# Patient Record
Sex: Female | Born: 1955 | Race: White | Hispanic: No | Marital: Married | Smoking: Current every day smoker
Health system: Southern US, Community
[De-identification: ages and names within clinical notes are randomized; demographics above are authoritative.]

## PROBLEM LIST (undated history)

## (undated) DIAGNOSIS — S329XXA Fracture of unspecified parts of lumbosacral spine and pelvis, initial encounter for closed fracture: Secondary | ICD-10-CM

## (undated) DIAGNOSIS — Z8719 Personal history of other diseases of the digestive system: Secondary | ICD-10-CM

## (undated) DIAGNOSIS — J189 Pneumonia, unspecified organism: Secondary | ICD-10-CM

## (undated) DIAGNOSIS — D649 Anemia, unspecified: Secondary | ICD-10-CM

## (undated) DIAGNOSIS — S0291XA Unspecified fracture of skull, initial encounter for closed fracture: Secondary | ICD-10-CM

## (undated) DIAGNOSIS — A499 Bacterial infection, unspecified: Secondary | ICD-10-CM

## (undated) DIAGNOSIS — N390 Urinary tract infection, site not specified: Secondary | ICD-10-CM

## (undated) DIAGNOSIS — I639 Cerebral infarction, unspecified: Secondary | ICD-10-CM

## (undated) DIAGNOSIS — M797 Fibromyalgia: Secondary | ICD-10-CM

## (undated) DIAGNOSIS — K759 Inflammatory liver disease, unspecified: Secondary | ICD-10-CM

## (undated) DIAGNOSIS — I739 Peripheral vascular disease, unspecified: Secondary | ICD-10-CM

## (undated) HISTORY — PX: ABDOMINAL HYSTERECTOMY: SHX81

## (undated) HISTORY — PX: OTHER SURGICAL HISTORY: SHX169

## (undated) HISTORY — PX: BLADDER SURGERY: SHX569

## (undated) HISTORY — PX: DILATION AND CURETTAGE OF UTERUS: SHX78

## (undated) HISTORY — PX: TONSILLECTOMY: SUR1361

## (undated) HISTORY — PX: FOOT SURGERY: SHX648

---

## 1998-05-11 ENCOUNTER — Other Ambulatory Visit: Admission: RE | Admit: 1998-05-11 | Discharge: 1998-05-11 | Payer: Self-pay | Admitting: *Deleted

## 1999-08-09 ENCOUNTER — Encounter: Admission: RE | Admit: 1999-08-09 | Discharge: 1999-10-02 | Payer: Self-pay | Admitting: Family Medicine

## 2000-04-02 ENCOUNTER — Emergency Department (HOSPITAL_COMMUNITY): Admission: EM | Admit: 2000-04-02 | Discharge: 2000-04-02 | Payer: Self-pay

## 2000-06-23 ENCOUNTER — Encounter: Admission: RE | Admit: 2000-06-23 | Discharge: 2000-07-18 | Payer: Self-pay | Admitting: Family Medicine

## 2001-02-15 ENCOUNTER — Encounter: Payer: Self-pay | Admitting: Emergency Medicine

## 2001-02-15 ENCOUNTER — Emergency Department (HOSPITAL_COMMUNITY): Admission: EM | Admit: 2001-02-15 | Discharge: 2001-02-16 | Payer: Self-pay | Admitting: *Deleted

## 2002-01-01 ENCOUNTER — Emergency Department (HOSPITAL_COMMUNITY): Admission: EM | Admit: 2002-01-01 | Discharge: 2002-01-01 | Payer: Self-pay | Admitting: Emergency Medicine

## 2002-01-01 ENCOUNTER — Encounter: Payer: Self-pay | Admitting: Emergency Medicine

## 2002-01-06 ENCOUNTER — Emergency Department (HOSPITAL_COMMUNITY): Admission: EM | Admit: 2002-01-06 | Discharge: 2002-01-06 | Payer: Self-pay | Admitting: Emergency Medicine

## 2002-01-06 ENCOUNTER — Encounter: Payer: Self-pay | Admitting: Emergency Medicine

## 2002-01-08 ENCOUNTER — Emergency Department (HOSPITAL_COMMUNITY): Admission: EM | Admit: 2002-01-08 | Discharge: 2002-01-08 | Payer: Self-pay | Admitting: Emergency Medicine

## 2002-01-08 ENCOUNTER — Encounter: Payer: Self-pay | Admitting: Emergency Medicine

## 2002-01-09 ENCOUNTER — Emergency Department (HOSPITAL_COMMUNITY): Admission: EM | Admit: 2002-01-09 | Discharge: 2002-01-09 | Payer: Self-pay | Admitting: Emergency Medicine

## 2002-01-22 ENCOUNTER — Ambulatory Visit (HOSPITAL_COMMUNITY): Admission: RE | Admit: 2002-01-22 | Discharge: 2002-01-22 | Payer: Self-pay | Admitting: Family Medicine

## 2002-01-22 ENCOUNTER — Emergency Department (HOSPITAL_COMMUNITY): Admission: EM | Admit: 2002-01-22 | Discharge: 2002-01-22 | Payer: Self-pay | Admitting: Emergency Medicine

## 2002-01-22 ENCOUNTER — Encounter: Payer: Self-pay | Admitting: Family Medicine

## 2002-01-25 ENCOUNTER — Ambulatory Visit (HOSPITAL_COMMUNITY): Admission: RE | Admit: 2002-01-25 | Discharge: 2002-01-25 | Payer: Self-pay | Admitting: Family Medicine

## 2002-01-25 ENCOUNTER — Encounter: Payer: Self-pay | Admitting: Family Medicine

## 2003-05-17 ENCOUNTER — Encounter: Admission: RE | Admit: 2003-05-17 | Discharge: 2003-06-14 | Payer: Self-pay | Admitting: Endocrinology

## 2005-08-02 ENCOUNTER — Encounter: Admission: RE | Admit: 2005-08-02 | Discharge: 2005-08-02 | Payer: Self-pay | Admitting: Endocrinology

## 2010-10-08 ENCOUNTER — Ambulatory Visit: Payer: Self-pay | Admitting: Vascular Surgery

## 2010-12-15 ENCOUNTER — Encounter: Payer: Self-pay | Admitting: Endocrinology

## 2011-01-15 ENCOUNTER — Ambulatory Visit (INDEPENDENT_AMBULATORY_CARE_PROVIDER_SITE_OTHER): Payer: Medicare Other | Admitting: Vascular Surgery

## 2011-01-15 DIAGNOSIS — I83893 Varicose veins of bilateral lower extremities with other complications: Secondary | ICD-10-CM

## 2011-01-16 NOTE — Assessment & Plan Note (Signed)
OFFICE VISIT  KORTNEE, BAS DOB:  1956-07-29                                       01/15/2011 ZOXWR#:[id number]  The patient returns today for follow-up regarding her venous insufficiency of both lower extremities.  She has continued to have aching, throbbing and burning discomfort in both legs despite wearing long-leg elastic compression stockings, elevating her legs, and trying ibuprofen.  She has a history of acute thrombophlebitis in the right ankle where she has thrombus in a superficial varix in the past.  She continues to have worsening of her symptoms as the day progresses.  She has no history of stasis ulcers, bleeding or other complications.  She has documented gross reflux in both great saphenous veins up to and including the saphenofemoral junction with a widely patent deep system.  On exam today blood pressure is 138/78, heart rate 72, respirations 20. Her lower extremity exam reveals bulging varicosities in the distal thigh on the left and the medial calf on the left great saphenous system.  The right side has bulging varicosities below the knee over the great saphenous system with both legs have 1+ edema and early hyperpigmentation.  I would recommend proceeding with staged laser ablation with stab phlebectomies beginning with the right side and followed by the left frontal to be performed in the future.  We will proceed with precertification to perform this in the near future.    [name] [place], M.D. Electronically Signed  JDL/MEDQ  D:  01/15/2011  T:  01/16/2011  Job:  [id number]

## 2011-04-09 NOTE — Procedures (Signed)
LOWER EXTREMITY VENOUS REFLUX EXAM   INDICATION:  Varicose veins and pain, per standing order.   EXAM:  Using color-flow imaging and pulse Doppler spectral analysis, the  bilateral common femoral, superficial femoral, popliteal, posterior  tibial, greater and lesser saphenous veins were evaluated.  There is  evidence suggesting deep venous Reflux of  >500 milliseconds noted in  the bilateral common femoral and right popliteal veins.   The bilateral saphenofemoral junctions and nontortuous greater saphenous  veins are not competent with reflux of greater than 500 milliseconds.   The bilateral proximal short saphenous veins demonstrate competency.   GSV Diameter (used if found to be incompetent only)                                            Right         Left  Proximal Greater Saphenous Vein           0.93 cm       0.67 cm  Proximal-to-mid-thigh                     0.82 cm       0.5 cm  Mid thigh                                 0.74 cm       0.4 cm  Mid-distal thigh                          cm            cm  Distal thigh                              0.89 cm       0.59 cm  Knee                                      cm            cm   IMPRESSION:  1. Bilateral greater saphenous vein Reflux is noted, as described      above.  2. Deep venous system is noted, as described above.  3. The bilateral short saphenous veins are competent.   ___________________________________________  Quita Skye. Hart [place], M.D.   CH/MEDQ  D:  10/09/2010  T:  10/09/2010  Job:  [id number]

## 2011-04-09 NOTE — Consult Note (Signed)
NEW PATIENT CONSULTATION   Samantha Holmes, SCISM D  DOB:  [birth year]                                       10/08/2010  ZOXWR#:[id number]   The patient is a 55 year old female with a long history of venous  insufficiency of both lower extremities which has progressively caused  aching, throbbing and burning discomfort in both legs.  Several months  ago she experienced acute thrombophlebitis in the right ankle area where  she had thrombus in a superficial varix.  This was treated with heat and  analgesics and eventually resolved.  She has no history of deep venous  thrombosis but does have chronic swelling in both ankles as the day  progresses, right worse than left.  She describes throbbing and aching  discomfort.  She has no history of stasis ulcers, bleeding or other  complications.  She has worn short-leg elastic compression stockings  with some improvement but does not elevate her legs nor take pain  medication on a regular basis for this.   CHRONIC MEDICAL PROBLEMS:  1. Fibromyalgia.  2. Hyperlipidemia.  3. Remote history of right foot surgery by Dr. Jovita Gamma with chronic      pain.  4. History of fractured pelvis and skull fracture.  5. Negative for diabetes, hypertension, coronary artery disease, COPD      or stroke.   FAMILY HISTORY:  Positive for varicose veins in her sister who had vein  stripping and in her father.  Negative for coronary artery disease,  diabetes and stroke.   SOCIAL HISTORY:  She is married, does not have children.  She is  retired.  She smokes less than a pack of cigarettes per day.  Does not  use alcohol.   REVIEW OF SYSTEMS:  Positive for weight loss, hiatal hernia which is  asymptomatic, lower extremity discomfort as noted, headaches, mini  stroke in the past, fibromyalgia and rashes.  All other systems are  negative.   PHYSICAL EXAMINATION:  Vital signs:  Blood pressure 125/80, heart rate  79, respirations 20.  General:  She  is a well-developed, well-nourished  female in no apparent distress, alert and oriented x3.  HEENT:  Normal  for age.  EOMs intact.  Lungs:  Clear to auscultation.  No rhonchi or  wheezing.  Cardiovascular:  Regular rhythm.  No murmurs.  Abdomen:  Soft, nontender with no masses.  Musculoskeletal:  Free of major  deformities.  Neurological:  Normal.  Lower extremity exam reveals 3+  femoral, popliteal and dorsalis pedis pulses bilaterally.  Skin:  Reveals bulging varicosities bilaterally in the great saphenous system  in the medial thigh, left greater than right; in the medial calf right  equal to left with an area where the thrombophlebitis occurred just  proximal to the right medial malleolus.  There is an extensive network  of reticular and spider veins extending into the feet bilaterally with  1+ edema bilaterally and some pretibial bulging varicosities as well.  No active ulcers noted.   Today I ordered a venous duplex exam which I reviewed and interpreted.  She has some mild reflux in the deep systems bilaterally with no DVT.  She has bilateral severe reflux in the great saphenous systems extending  up to and including the saphenofemoral junction.   This patient has had significant symptoms involving her valvular  incompetence and venous  insufficiency of both great saphenous systems  which have been affecting her daily living and have also resulted in the  complication of thrombophlebitis in the right leg.  We will treat her  with long-leg elastic compression stockings (20 mm - 30 mm gradient) as  well as elevation and ibuprofen.  She will return in 3 months and unless  there has been significant improvement I think we should proceed with:  1) laser ablation of the right great saphenous vein with multiple stab  phlebectomies to be followed by 2) laser ablation of the left great  saphenous vein with multiple stab phlebectomies.     Samantha Holmes, M.D.  Electronically  Signed   JDL/MEDQ  D:  10/08/2010  T:  10/09/2010  Job:  [id number]   cc:   Brooke Bonito, M.D.

## 2012-10-28 ENCOUNTER — Other Ambulatory Visit: Payer: Self-pay | Admitting: Pain Medicine

## 2012-10-30 ENCOUNTER — Encounter (HOSPITAL_COMMUNITY): Payer: Self-pay | Admitting: *Deleted

## 2012-10-30 NOTE — Progress Notes (Signed)
Patient has temporary cap on upper right tooth.

## 2012-11-02 ENCOUNTER — Encounter (HOSPITAL_COMMUNITY): Payer: Self-pay | Admitting: Certified Registered Nurse Anesthetist

## 2012-11-02 ENCOUNTER — Observation Stay (HOSPITAL_COMMUNITY)
Admission: RE | Admit: 2012-11-02 | Discharge: 2012-11-03 | Disposition: A | Discharge status: Home / Self Care | Payer: Medicare Other | Source: Ambulatory Visit | Attending: Specialist | Admitting: Specialist

## 2012-11-02 ENCOUNTER — Encounter (HOSPITAL_COMMUNITY): Payer: Self-pay | Admitting: *Deleted

## 2012-11-02 ENCOUNTER — Ambulatory Visit (HOSPITAL_COMMUNITY): Payer: Medicare Other

## 2012-11-02 ENCOUNTER — Ambulatory Visit (HOSPITAL_COMMUNITY): Payer: Medicare Other | Admitting: Certified Registered Nurse Anesthetist

## 2012-11-02 ENCOUNTER — Encounter (HOSPITAL_COMMUNITY)
Admission: RE | Disposition: A | Discharge status: Home / Self Care | Payer: Self-pay | Source: Ambulatory Visit | Attending: Specialist

## 2012-11-02 DIAGNOSIS — Z9889 Other specified postprocedural states: Secondary | ICD-10-CM

## 2012-11-02 DIAGNOSIS — Z8673 Personal history of transient ischemic attack (TIA), and cerebral infarction without residual deficits: Secondary | ICD-10-CM | POA: Insufficient documentation

## 2012-11-02 DIAGNOSIS — IMO0001 Reserved for inherently not codable concepts without codable children: Secondary | ICD-10-CM | POA: Insufficient documentation

## 2012-11-02 DIAGNOSIS — Z79899 Other long term (current) drug therapy: Secondary | ICD-10-CM | POA: Insufficient documentation

## 2012-11-02 DIAGNOSIS — K449 Diaphragmatic hernia without obstruction or gangrene: Secondary | ICD-10-CM | POA: Insufficient documentation

## 2012-11-02 DIAGNOSIS — S82009A Unspecified fracture of unspecified patella, initial encounter for closed fracture: Principal | ICD-10-CM | POA: Insufficient documentation

## 2012-11-02 DIAGNOSIS — R296 Repeated falls: Secondary | ICD-10-CM | POA: Insufficient documentation

## 2012-11-02 DIAGNOSIS — I739 Peripheral vascular disease, unspecified: Secondary | ICD-10-CM | POA: Insufficient documentation

## 2012-11-02 DIAGNOSIS — Z01811 Encounter for preprocedural respiratory examination: Secondary | ICD-10-CM | POA: Insufficient documentation

## 2012-11-02 DIAGNOSIS — Z7982 Long term (current) use of aspirin: Secondary | ICD-10-CM | POA: Insufficient documentation

## 2012-11-02 DIAGNOSIS — F17200 Nicotine dependence, unspecified, uncomplicated: Secondary | ICD-10-CM | POA: Insufficient documentation

## 2012-11-02 HISTORY — DX: Personal history of other diseases of the digestive system: Z87.19

## 2012-11-02 HISTORY — DX: Inflammatory liver disease, unspecified: K75.9

## 2012-11-02 HISTORY — DX: Cerebral infarction, unspecified: I63.9

## 2012-11-02 HISTORY — DX: Anemia, unspecified: D64.9

## 2012-11-02 HISTORY — DX: Fibromyalgia: M79.7

## 2012-11-02 HISTORY — PX: ORIF PATELLA: SHX5033

## 2012-11-02 HISTORY — DX: Peripheral vascular disease, unspecified: I73.9

## 2012-11-02 HISTORY — DX: Fracture of unspecified parts of lumbosacral spine and pelvis, initial encounter for closed fracture: S32.9XXA

## 2012-11-02 HISTORY — DX: Bacterial infection, unspecified: A49.9

## 2012-11-02 HISTORY — DX: Unspecified fracture of skull, initial encounter for closed fracture: S02.91XA

## 2012-11-02 HISTORY — DX: Pneumonia, unspecified organism: J18.9

## 2012-11-02 HISTORY — DX: Urinary tract infection, site not specified: N39.0

## 2012-11-02 LAB — BASIC METABOLIC PANEL
BUN: 12 mg/dL (ref 6–23)
Calcium: 9.7 mg/dL (ref 8.4–10.5)
Creatinine, Ser: 0.74 mg/dL (ref 0.50–1.10)
GFR calc non Af Amer: >90 mL/min (ref >90)
Glucose, Bld: 114 mg/dL — ABNORMAL HIGH (ref 70–99)
Potassium: 4.3 mEq/L (ref 3.5–5.1)

## 2012-11-02 LAB — CBC
MCH: 29.6 pg (ref 26.0–34.0)
MCHC: 32.6 g/dL (ref 30.0–36.0)
MCV: 90.9 fL (ref 78.0–100.0)
Platelets: 317 10*3/uL (ref 150–400)
RDW: 13.3 % (ref 11.5–15.5)

## 2012-11-02 LAB — SURGICAL PCR SCREEN: MRSA, PCR: NEGATIVE

## 2012-11-02 SURGERY — OPEN REDUCTION INTERNAL FIXATION (ORIF) PATELLA
Anesthesia: General | Site: Knee | Laterality: Right | Wound class: Clean

## 2012-11-02 MED ORDER — HYDROMORPHONE HCL PF 1 MG/ML IJ SOLN: 0.5000 mg | INTRAMUSCULAR | Status: DC | PRN

## 2012-11-02 MED ORDER — MUPIROCIN 2 % EX OINT
TOPICAL_OINTMENT | Freq: Two times a day (BID) | CUTANEOUS | Status: DC
  Administered 2012-11-02: 1 via NASAL
  Filled 2012-11-02 (×2): qty 22

## 2012-11-02 MED ORDER — ONDANSETRON HCL 4 MG/2ML IJ SOLN: 4.0000 mg | Freq: Four times a day (QID) | INTRAMUSCULAR | Status: DC | PRN

## 2012-11-02 MED ORDER — FENTANYL CITRATE 0.05 MG/ML IJ SOLN
25.0000 ug | INTRAMUSCULAR | Status: DC | PRN
  Administered 2012-11-02 (×2): 25 ug via INTRAVENOUS

## 2012-11-02 MED ORDER — CEFAZOLIN SODIUM-DEXTROSE 2-3 GM-% IV SOLR
2.0000 g | INTRAVENOUS | Status: AC
  Administered 2012-11-02: 2 g via INTRAVENOUS

## 2012-11-02 MED ORDER — LACTATED RINGERS IV SOLN
INTRAVENOUS | Status: DC | PRN
  Administered 2012-11-02: 16:00:00 via INTRAVENOUS

## 2012-11-02 MED ORDER — DOCUSATE SODIUM 100 MG PO CAPS
100.0000 mg | ORAL_CAPSULE | Freq: Two times a day (BID) | ORAL | Status: DC
  Administered 2012-11-02 – 2012-11-03 (×2): 100 mg via ORAL

## 2012-11-02 MED ORDER — DIPHENHYDRAMINE HCL 12.5 MG/5ML PO ELIX: 12.5000 mg | ORAL_SOLUTION | ORAL | Status: DC | PRN

## 2012-11-02 MED ORDER — BISACODYL 10 MG RE SUPP: 10.0000 mg | Freq: Every day | RECTAL | Status: DC | PRN

## 2012-11-02 MED ORDER — ESMOLOL HCL 10 MG/ML IV SOLN
INTRAVENOUS | Status: DC | PRN
  Administered 2012-11-02: 20 mg via INTRAVENOUS

## 2012-11-02 MED ORDER — ZOLPIDEM TARTRATE 5 MG PO TABS: 5.0000 mg | ORAL_TABLET | Freq: Every evening | ORAL | Status: DC | PRN

## 2012-11-02 MED ORDER — HYDROMORPHONE HCL PF 1 MG/ML IJ SOLN
INTRAMUSCULAR | Status: DC | PRN
  Administered 2012-11-02 (×2): 0.5 mg via INTRAVENOUS
  Administered 2012-11-02: 1 mg via INTRAVENOUS

## 2012-11-02 MED ORDER — CEFAZOLIN SODIUM-DEXTROSE 2-3 GM-% IV SOLR
2.0000 g | Freq: Four times a day (QID) | INTRAVENOUS | Status: AC
  Administered 2012-11-02 – 2012-11-03 (×3): 2 g via INTRAVENOUS
  Filled 2012-11-02 (×3): qty 50

## 2012-11-02 MED ORDER — ONDANSETRON HCL 4 MG PO TABS: 4.0000 mg | ORAL_TABLET | Freq: Four times a day (QID) | ORAL | Status: DC | PRN

## 2012-11-02 MED ORDER — NEOSTIGMINE METHYLSULFATE 1 MG/ML IJ SOLN
INTRAMUSCULAR | Status: DC | PRN
  Administered 2012-11-02: 5 mg via INTRAVENOUS

## 2012-11-02 MED ORDER — PROMETHAZINE HCL 25 MG/ML IJ SOLN: 6.2500 mg | INTRAMUSCULAR | Status: DC | PRN

## 2012-11-02 MED ORDER — SUCCINYLCHOLINE CHLORIDE 20 MG/ML IJ SOLN
INTRAMUSCULAR | Status: DC | PRN
  Administered 2012-11-02: 100 mg via INTRAVENOUS

## 2012-11-02 MED ORDER — FLEET ENEMA 7-19 GM/118ML RE ENEM: 1.0000 | ENEMA | Freq: Once | RECTAL | Status: AC | PRN

## 2012-11-02 MED ORDER — POLYETHYLENE GLYCOL 3350 17 G PO PACK: 17.0000 g | PACK | Freq: Every day | ORAL | Status: DC | PRN

## 2012-11-02 MED ORDER — SODIUM CHLORIDE 0.9 % IV SOLN
INTRAVENOUS | Status: DC
  Administered 2012-11-02: 22:00:00 via INTRAVENOUS

## 2012-11-02 MED ORDER — LIDOCAINE HCL (CARDIAC) 20 MG/ML IV SOLN
INTRAVENOUS | Status: DC | PRN
  Administered 2012-11-02: 100 mg via INTRAVENOUS

## 2012-11-02 MED ORDER — ACETAMINOPHEN 10 MG/ML IV SOLN
1000.0000 mg | Freq: Four times a day (QID) | INTRAVENOUS | Status: DC
  Administered 2012-11-03 (×3): 1000 mg via INTRAVENOUS
  Filled 2012-11-02 (×5): qty 100

## 2012-11-02 MED ORDER — ACETAMINOPHEN 10 MG/ML IV SOLN
INTRAVENOUS | Status: DC | PRN
  Administered 2012-11-02: 1000 mg via INTRAVENOUS

## 2012-11-02 MED ORDER — HYDROMORPHONE HCL PF 1 MG/ML IJ SOLN
INTRAMUSCULAR | Status: AC
  Administered 2012-11-02: 0.5 mg
  Filled 2012-11-02: qty 1

## 2012-11-02 MED ORDER — METOCLOPRAMIDE HCL 10 MG PO TABS: 5.0000 mg | ORAL_TABLET | Freq: Three times a day (TID) | ORAL | Status: DC | PRN

## 2012-11-02 MED ORDER — ASPIRIN 325 MG PO TABS
325.0000 mg | ORAL_TABLET | Freq: Two times a day (BID) | ORAL | Status: DC
  Administered 2012-11-02 – 2012-11-03 (×2): 325 mg via ORAL
  Filled 2012-11-02 (×3): qty 1

## 2012-11-02 MED ORDER — METHOCARBAMOL 500 MG PO TABS
500.0000 mg | ORAL_TABLET | Freq: Four times a day (QID) | ORAL | Status: DC | PRN
  Administered 2012-11-03 (×2): 500 mg via ORAL
  Filled 2012-11-02 (×3): qty 1

## 2012-11-02 MED ORDER — ONDANSETRON HCL 4 MG/2ML IJ SOLN
INTRAMUSCULAR | Status: DC | PRN
  Administered 2012-11-02: 4 mg via INTRAVENOUS

## 2012-11-02 MED ORDER — METOCLOPRAMIDE HCL 5 MG/ML IJ SOLN: 5.0000 mg | Freq: Three times a day (TID) | INTRAMUSCULAR | Status: DC | PRN

## 2012-11-02 MED ORDER — HYDROMORPHONE HCL PF 1 MG/ML IJ SOLN
0.2500 mg | INTRAMUSCULAR | Status: DC | PRN
Start: 2012-11-02 — End: 2012-11-02
  Administered 2012-11-02: 0.5 mg via INTRAVENOUS
  Administered 2012-11-02: 0.25 mg via INTRAVENOUS
  Administered 2012-11-02: 0.5 mg via INTRAVENOUS
  Administered 2012-11-02: 0.25 mg via INTRAVENOUS
  Administered 2012-11-02: 0.5 mg via INTRAVENOUS

## 2012-11-02 MED ORDER — ENOXAPARIN SODIUM 30 MG/0.3ML ~~LOC~~ SOLN
30.0000 mg | Freq: Two times a day (BID) | SUBCUTANEOUS | Status: DC
  Administered 2012-11-03: 30 mg via SUBCUTANEOUS
  Filled 2012-11-02 (×3): qty 0.3

## 2012-11-02 MED ORDER — METHOCARBAMOL 100 MG/ML IJ SOLN
500.0000 mg | Freq: Four times a day (QID) | INTRAVENOUS | Status: DC | PRN
  Administered 2012-11-02: 500 mg via INTRAVENOUS
  Filled 2012-11-02: qty 5

## 2012-11-02 MED ORDER — GLYCOPYRROLATE 0.2 MG/ML IJ SOLN
INTRAMUSCULAR | Status: DC | PRN
  Administered 2012-11-02: 0.6 mg via INTRAVENOUS

## 2012-11-02 MED ORDER — 0.9 % SODIUM CHLORIDE (POUR BTL) OPTIME
TOPICAL | Status: DC | PRN
  Administered 2012-11-02: 1000 mL

## 2012-11-02 MED ORDER — FENTANYL CITRATE 0.05 MG/ML IJ SOLN
INTRAMUSCULAR | Status: DC | PRN
  Administered 2012-11-02 (×5): 50 ug via INTRAVENOUS

## 2012-11-02 MED ORDER — ROCURONIUM BROMIDE 100 MG/10ML IV SOLN
INTRAVENOUS | Status: DC | PRN
  Administered 2012-11-02: 30 mg via INTRAVENOUS

## 2012-11-02 MED ORDER — PROPOFOL 10 MG/ML IV BOLUS
INTRAVENOUS | Status: DC | PRN
  Administered 2012-11-02: 150 mg via INTRAVENOUS

## 2012-11-02 MED ORDER — OXYCODONE HCL 5 MG PO TABS
5.0000 mg | ORAL_TABLET | ORAL | Status: DC | PRN
  Administered 2012-11-02: 5 mg via ORAL
  Administered 2012-11-03 (×4): 10 mg via ORAL
  Filled 2012-11-02: qty 1
  Filled 2012-11-02 (×4): qty 2

## 2012-11-02 MED ORDER — SODIUM CHLORIDE 0.9 % IV SOLN: INTRAVENOUS | Status: DC

## 2012-11-02 MED ORDER — NAPHAZOLINE-PHENIRAMINE 0.025-0.3 % OP SOLN: 2.0000 [drp] | Freq: Three times a day (TID) | OPHTHALMIC | Status: DC | PRN

## 2012-11-02 MED ORDER — POVIDONE-IODINE 7.5 % EX SOLN: Freq: Once | CUTANEOUS | Status: DC

## 2012-11-02 MED ORDER — SODIUM CHLORIDE 0.9 % IV SOLN
INTRAVENOUS | Status: DC
  Administered 2012-11-02: 1000 mL via INTRAVENOUS

## 2012-11-02 SURGICAL SUPPLY — 51 items
BAG ZIPLOCK 12X15 (MISCELLANEOUS) ×2 IMPLANT
BANDAGE ELASTIC 6 VELCRO ST LF (GAUZE/BANDAGES/DRESSINGS) ×2 IMPLANT
BANDAGE GAUZE ELAST BULKY 4 IN (GAUZE/BANDAGES/DRESSINGS) ×2 IMPLANT
BIT DRILL 2.4X128 (BIT) ×2 IMPLANT
BNDG COHESIVE 4X5 TAN STRL (GAUZE/BANDAGES/DRESSINGS) IMPLANT
BNDG COHESIVE 6X5 TAN STRL LF (GAUZE/BANDAGES/DRESSINGS) ×2 IMPLANT
CLOTH BEACON ORANGE TIMEOUT ST (SAFETY) ×2 IMPLANT
CUFF TOURN SGL QUICK 34 (TOURNIQUET CUFF) ×1
CUFF TRNQT CYL 34X4X40X1 (TOURNIQUET CUFF) ×1 IMPLANT
DRAPE INCISE IOBAN 66X45 STRL (DRAPES) IMPLANT
DRAPE LG THREE QUARTER DISP (DRAPES) ×2 IMPLANT
DRAPE OEC MINIVIEW 54X84 (DRAPES) ×2 IMPLANT
DRAPE U-SHAPE 47X51 STRL (DRAPES) ×2 IMPLANT
DRSG EMULSION OIL 3X16 NADH (GAUZE/BANDAGES/DRESSINGS) IMPLANT
DRSG PAD ABDOMINAL 8X10 ST (GAUZE/BANDAGES/DRESSINGS) ×2 IMPLANT
DURAPREP 26ML APPLICATOR (WOUND CARE) ×2 IMPLANT
ELECT REM PT RETURN 9FT ADLT (ELECTROSURGICAL) ×2
ELECTRODE REM PT RTRN 9FT ADLT (ELECTROSURGICAL) ×1 IMPLANT
FACESHIELD LNG OPTICON STERILE (SAFETY) ×4 IMPLANT
GLOVE BIOGEL PI IND STRL 8 (GLOVE) ×2 IMPLANT
GLOVE BIOGEL PI INDICATOR 8 (GLOVE) ×2
GLOVE SURG ORTHO 8.0 STRL STRW (GLOVE) ×2 IMPLANT
GOWN STRL REIN XL XLG (GOWN DISPOSABLE) ×2 IMPLANT
IMMOBILIZER KNEE 20 (SOFTGOODS)
IMMOBILIZER KNEE 20 THIGH 36 (SOFTGOODS) IMPLANT
KIT BASIN OR (CUSTOM PROCEDURE TRAY) ×2 IMPLANT
MANIFOLD NEPTUNE II (INSTRUMENTS) IMPLANT
NEEDLE MA TROC 1/2 (NEEDLE) ×2 IMPLANT
NS IRRIG 1000ML POUR BTL (IV SOLUTION) IMPLANT
PACK LOWER EXTREMITY WL (CUSTOM PROCEDURE TRAY) ×2 IMPLANT
PAD CAST 4YDX4 CTTN HI CHSV (CAST SUPPLIES) IMPLANT
PADDING CAST COTTON 4X4 STRL (CAST SUPPLIES)
PASSER SUT SWANSON 36MM LOOP (INSTRUMENTS) ×2 IMPLANT
POSITIONER SURGICAL ARM (MISCELLANEOUS) IMPLANT
SPONGE GAUZE 4X4 12PLY (GAUZE/BANDAGES/DRESSINGS) ×2 IMPLANT
SPONGE LAP 18X18 X RAY DECT (DISPOSABLE) ×2 IMPLANT
STOCKINETTE 8 INCH (MISCELLANEOUS) ×2 IMPLANT
STRIP CLOSURE SKIN 1/2X4 (GAUZE/BANDAGES/DRESSINGS) IMPLANT
SUCTION FRAZIER TIP 10 FR DISP (SUCTIONS) ×2 IMPLANT
SUT ETHILON 4 0 PS 2 18 (SUTURE) IMPLANT
SUT FIBERWIRE #2 38 T-5 BLUE (SUTURE) ×8
SUT MNCRL AB 3-0 PS2 18 (SUTURE) ×2 IMPLANT
SUT MNCRL AB 4-0 PS2 18 (SUTURE) IMPLANT
SUT VIC AB 1 CT1 27 (SUTURE) ×3
SUT VIC AB 1 CT1 27XBRD ANTBC (SUTURE) ×3 IMPLANT
SUT VIC AB 2-0 CT1 27 (SUTURE) ×2
SUT VIC AB 2-0 CT1 TAPERPNT 27 (SUTURE) ×2 IMPLANT
SUT VIC AB 2-0 CT2 27 (SUTURE) IMPLANT
SUTURE FIBERWR #2 38 T-5 BLUE (SUTURE) ×4 IMPLANT
TOWEL OR 17X26 10 PK STRL BLUE (TOWEL DISPOSABLE) ×4 IMPLANT
WATER STERILE IRR 1500ML POUR (IV SOLUTION) IMPLANT

## 2012-11-02 NOTE — Transfer of Care (Signed)
Immediate Anesthesia Transfer of Care Note  Patient: Samantha Holmes  Procedure(s) Performed: Procedure(s) (LRB): OPEN REDUCTION INTERNAL (ORIF) FIXATION PATELLA (Right)  Patient Location: PACU  Anesthesia Type: General  Level of Consciousness: sedated, patient cooperative and responds to stimulaton  Airway & Oxygen Therapy: Patient Spontanous Breathing and Patient connected to face mask oxgen  Post-op Assessment: Report given to PACU RN and Post -op Vital signs reviewed and stable  Post vital signs: Reviewed and stable  Complications: No apparent anesthesia complications

## 2012-11-02 NOTE — Anesthesia Preprocedure Evaluation (Addendum)
Anesthesia Evaluation  Patient identified by MRN, date of birth, ID band Patient awake    Reviewed: Allergy & Precautions, H&P , NPO status , Patient's Chart, lab work & pertinent test results, reviewed documented beta blocker date and time   Airway Mallampati: II TM Distance: >3 FB Neck ROM: full    Dental No notable dental hx.    Pulmonary pneumonia -, resolved, Current Smoker,  Pneumonia 2003 breath sounds clear to auscultation  Pulmonary exam normal       Cardiovascular Exercise Tolerance: Good + Peripheral Vascular Disease negative cardio ROS  Rhythm:regular Rate:Normal  Varicose veins.   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 No deficits.Had accident in 2007 involving skull.  Neuromuscular disease CVA, No Residual Symptoms negative psych ROS   GI/Hepatic negative GI ROS, Neg liver ROS, hiatal hernia, (+) Hepatitis -  Endo/Other  negative endocrine ROS  Renal/GU negative Renal ROS  negative genitourinary   Musculoskeletal   Abdominal   Peds  Hematology negative hematology ROS (+) anemia ,   Anesthesia Other Findings   Reproductive/Obstetrics negative OB ROS                         Anesthesia Physical Anesthesia Plan  ASA: III  Anesthesia Plan: General   Post-op Pain Management:    Induction:   Airway Management Planned: LMA  Additional Equipment:   Intra-op Plan:   Post-operative Plan:   Informed Consent: I have reviewed the patients History and Physical, chart, labs and discussed the procedure including the risks, benefits and alternatives for the proposed anesthesia with the patient or authorized representative who has indicated his/her understanding and acceptance.   Dental Advisory Given  Plan Discussed with: CRNA  Anesthesia Plan Comments: (Discussed risks of femoral nerve block including failure, bleeding, infection, nerve damage.  Femoral nerve  block does not usually prevent all pain. After long discussion of benefits and risks and the procedure of the block (including the offered sedation to perform the block), the patient declines the block and understands she may have increased pain after surgery.  She also declined a spinal after discussion of r/b/a..)      Anesthesia Quick Evaluation

## 2012-11-02 NOTE — H&P (Signed)
Samantha Holmes is an 56 y.o. female.   Chief Complaint: Right knee pain HPI: 80 yoa female fell on sidewalk landing on right knee causing pain swelling and brusing, in ER found to have right patella fx. Seen by Dr Thomasena Edis and determined to need ORIF vs patellectomy of right patella fx and pt agreed to proceed.  Past Medical History  Diagnosis Date  . Fibromyalgia   . Pneumonia     hx of in 2003   . Stroke     hx of ministrokes per pt on CT of head done 2007   . Peripheral vascular disease     varicose veins   . H/O hiatal hernia   . Urinary tract bacterial infections     hx of   . Anemia     hx of as a small child   . Hepatitis     hx of yellow jaundice in 3rd grade   . Pelvic fracture   . 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 Tonsillectomy   . Bladder surgery     as a child " dilate bladder"   . Foot surgery     right   . Cyst on ovary    . Dilation and curettage of uterus   . Abdominal hysterectomy     partial     History reviewed. No pertinent family history. Social History:  reports that she has been smoking Cigarettes.  She has a 15 pack-year smoking history. She has never used smokeless tobacco. She reports that she drinks alcohol. She reports that she does not use illicit drugs.  Allergies:  Allergies  Allergen Reactions  . Codeine Nausea And Vomiting  . Cymbalta (Duloxetine Hcl) Other (See Comments)    Due to side effects   . Lyrica (Pregabalin) Other (See Comments)    Vision problems and other side effects   . Macrodantin (Nitrofurantoin) Swelling    Reddened in face   . Sulfa Antibiotics     Unsure of reaction     Medications Prior to Admission  Medication Sig Dispense Refill  . aspirin 325 MG tablet Take 325 mg by mouth 2 (two) times daily.      . cephALEXin (KEFLEX) 500 MG capsule Take 500 mg by mouth daily. Used for skin problems  Related to history of  shingles      . CRANBERRY SOFT PO Take by mouth daily. Pt takes 84 mg of soft gel cranberry daily      . tetrahydrozoline-zinc (VISINE-AC) 0.05-0.25 % ophthalmic solution 2 drops 3 (three) times daily as needed. Patient uses several times per week      . traMADol (ULTRAM) 50 MG tablet Take 50 mg by mouth every 6 (six) hours as needed. 1-2 tables by mouth 4 times daily        No results found for this or any previous visit (from the past 48 hour(s)). Dg Chest 2 View  11/02/2012  *RADIOLOGY REPORT*  Clinical Data: Patellar fracture.  Preop respiratory exam.  CHEST - 2 VIEW  Comparison: 09/26/2010  Findings: Heart size remains within normal limits.  Both lungs are clear.  No evidence of pleural effusion.  No mass or lymphadenopathy identified.  The old right rib fracture deformities again noted.  IMPRESSION: Stable exam.  No active disease.   Original Report Authenticated By: Myles Rosenthal, M.D.  Review of Systems  All other systems reviewed and are negative.    Blood pressure 129/70, pulse 77, temperature 97.7 F (36.5 C), temperature source Oral, resp. rate 18, height 5\' 8"  (1.727 m), weight 98.431 kg (217 lb), SpO2 95.00%. Physical Exam  Patient is a conscious alert and well-developed female appears to be in no extreme distress in the OR preop area. Head is normocephalic neck is supple no palpable lymphadenopathy lungs were clear and equal throughout heart was regular rate and rhythm abdomen was soft bowel sounds present upper extremities were neuromotor vascularly intact with good range of motion lower extremity on the left head normal exam without any deficiencies right leg had a significantly ecchymotic knee with swelling very tender around the patella area the And thigh were soft nontender the ankle had good motion good pulses in the lower extremity rest rectal into exam is referred  Assessment/Plan Right knee comminuted patella fracture History of fibromyalgia  Plan patient be taken to  the OR for evaluation for consideration of ORIF versus patella ectomy of her right knee comminuted fracture patient will be kept overnight and discharged home tomorrow with routine followup in the office  [name] W 11/02/2012, 4:29 PM

## 2012-11-02 NOTE — Op Note (Signed)
Preop diagnosis right knee comminuted displaced unstable patella fracture Postoperative diagnosis same Procedure #1 right partial patellectomy. #2 repair of extensor mechanism.  Surgeon Valma Cava M.D. assistant Nadine Counts [facility] Anesthesia Gen. Estimated blood loss less than 50 cc Drains none Tourniquet time was 50 minutes at 2 300 mm mercury Complications none Disposition to PACU stable  Operative details patient was counseled in the holding area cracks I. was identified and marked appropriately.  Patient states operating room placed in the supine position under general anesthesia. Vital shunt was elevated prepped DuraPrep and draped into a sterile fashion. Knee was Elijah Birk out was done and confirmed. There was elevated exsanguinated with an Esmarch and tourniquet inflated to 300 mm mercury. Straight midline incision made through the skin subcutaneous tissues. There, was identified and opened longitudinally. Retinacular layer was dissected. The fracture was opened hematoma was removed knee was copious irrigated. SNF a comminuted inferior loss less than 20 septic the surface there was marked comminution the distal fragment. Subcutaneous this was a repairable it was reamed the scalpel lives drill holes were made and the patella #2 FiberWire sutures placed in a tendon grasping fashion with the girls in the patella anatomically repaired at the appropriate tension. This is an oversew with more on #2 the FiberWire suture. Lateral capsular openings with a repairable FiberWire suture. The retinaculum Vicryl suture. Subcutaneous Vicryl. Skin with a subcuticular Monocryl suture. Steri-Strips applied stertorous applied compressive wrap tourniquet deflated normal circulation in the foot at in the case. She also received 2 g of Ancef intravenously preoperatively prior to tourniquet inflation. Normal circulation in the foot and ankle. Placed into a brace in full extension. Awakened taken from operating room to PACU in  stable condition. To be kept overnight.  Help with patient positioning prepping draping technical and surgical assistance throughout the entire case wound closure application dressing and splint Mr. Oneida Alar PA-C assistance was needed.

## 2012-11-02 NOTE — Anesthesia Postprocedure Evaluation (Signed)
  Anesthesia Post-op Note  Patient: Samantha Holmes  Procedure(s) Performed: Procedure(s) (LRB): OPEN REDUCTION INTERNAL (ORIF) FIXATION PATELLA (Right)  Patient Location: PACU  Anesthesia Type: General  Level of Consciousness: awake and alert   Airway and Oxygen Therapy: Patient Spontanous Breathing  Post-op Pain: mild  Post-op Assessment: Post-op Vital signs reviewed, Patient's Cardiovascular Status Stable, Respiratory Function Stable, Patent Airway and No signs of Nausea or vomiting  Last Vitals:  Filed Vitals:   11/02/12 1915  BP: 144/63  Pulse: 70  Temp:   Resp: 16    Post-op Vital Signs: stable   Complications: No apparent anesthesia complications

## 2012-11-02 NOTE — Preoperative (Signed)
Beta Blockers   Reason not to administer Beta Blockers:Not Applicable 

## 2012-11-02 NOTE — H&P (Signed)
Samantha Holmes is an 56 y.o. female.   Chief Complaint: Right knee pain HPI: 80 yoa female fell on sidewalk landing on right knee causing pain swelling and brusing, in ER found to have right patella fx. Seen by Dr Thomasena Edis and determined to need ORIF vs patellectomy of right patella fx and pt agreed to proceed.  Past Medical History  Diagnosis Date  . Fibromyalgia   . Pneumonia     hx of in 2003   . Stroke     hx of ministrokes per pt on CT of head done 2007   . Peripheral vascular disease     varicose veins   . H/O hiatal hernia   . Urinary tract bacterial infections     hx of   . Anemia     hx of as a small child   . Hepatitis     hx of yellow jaundice in 3rd grade   . Pelvic fracture   . 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 Tonsillectomy   . Bladder surgery     as a child " dilate bladder"   . Foot surgery     right   . Cyst on ovary    . Dilation and curettage of uterus   . Abdominal hysterectomy     partial     History reviewed. No pertinent family history. Social History:  reports that she has been smoking Cigarettes.  She has a 15 pack-year smoking history. She has never used smokeless tobacco. She reports that she drinks alcohol. She reports that she does not use illicit drugs.  Allergies:  Allergies  Allergen Reactions  . Codeine Nausea And Vomiting  . Cymbalta (Duloxetine Hcl) Other (See Comments)    Due to side effects   . Lyrica (Pregabalin) Other (See Comments)    Vision problems and other side effects   . Macrodantin (Nitrofurantoin) Swelling    Reddened in face   . Sulfa Antibiotics     Unsure of reaction     Medications Prior to Admission  Medication Sig Dispense Refill  . aspirin 325 MG tablet Take 325 mg by mouth 2 (two) times daily.      . cephALEXin (KEFLEX) 500 MG capsule Take 500 mg by mouth daily. Used for skin problems  Related to history of  shingles      . CRANBERRY SOFT PO Take by mouth daily. Pt takes 84 mg of soft gel cranberry daily      . tetrahydrozoline-zinc (VISINE-AC) 0.05-0.25 % ophthalmic solution 2 drops 3 (three) times daily as needed. Patient uses several times per week      . traMADol (ULTRAM) 50 MG tablet Take 50 mg by mouth every 6 (six) hours as needed. 1-2 tables by mouth 4 times daily        Results for orders placed during the hospital encounter of 11/02/12 (from the past 48 hour(s))  BASIC METABOLIC PANEL     Status: Abnormal   Collection Time   11/02/12  3:25 PM      Component Value Range Comment   Sodium 139  135 - 145 mEq/L    Potassium 4.3  3.5 - 5.1 mEq/L    Chloride 104  96 - 112 mEq/L    CO2 25  19 - 32 mEq/L    Glucose, Bld 114 (*) 70 -  99 mg/dL    BUN 12  6 - 23 mg/dL    Creatinine, Ser 7.25  0.50 - 1.10 mg/dL    Calcium 9.7  8.4 - 36.6 mg/dL    GFR calc non Af Amer >90  >90 mL/min    GFR calc Af Amer >90  >90 mL/min    Dg Chest 2 View  11/02/2012  *RADIOLOGY REPORT*  Clinical Data: Patellar fracture.  Preop respiratory exam.  CHEST - 2 VIEW  Comparison: 09/26/2010  Findings: Heart size remains within normal limits.  Both lungs are clear.  No evidence of pleural effusion.  No mass or lymphadenopathy identified.  The old right rib fracture deformities again noted.  IMPRESSION: Stable exam.  No active disease.   Original Report Authenticated By: Myles Rosenthal, M.D.     Review of Systems  All other systems reviewed and are negative.    Blood pressure 129/70, pulse 77, temperature 97.7 F (36.5 C), temperature source Oral, resp. rate 18, height 5\' 8"  (1.727 m), weight 98.431 kg (217 lb), SpO2 95.00%. Physical Exam  Patient is a conscious alert and well-developed female appears to be in no extreme distress in the OR preop area. Head is normocephalic neck is supple no palpable lymphadenopathy lungs were clear and equal throughout heart was regular rate and rhythm abdomen was soft bowel sounds  present upper extremities were neuromotor vascularly intact with good range of motion lower extremity on the left head normal exam without any deficiencies right leg had a significantly ecchymotic knee with swelling very tender around the patella area the And thigh were soft nontender the ankle had good motion good pulses in the lower extremity rest rectal into exam is referred  Assessment/Plan Right knee comminuted patella fracture History of fibromyalgia  Plan patient be taken to the OR for evaluation for consideration of ORIF versus patella ectomy of her right knee comminuted fracture patient will be kept overnight and discharged home tomorrow with routine followup in the office  Renette Hsu ANDREW 11/02/2012, 4:42 PM   I have seen and examined this patient.  Agree with the note above.  Charlisha Market ANDREW 11/02/2012 4:42 PM

## 2012-11-03 ENCOUNTER — Encounter (HOSPITAL_COMMUNITY): Payer: Self-pay | Admitting: Specialist

## 2012-11-03 MED ORDER — METHOCARBAMOL 500 MG PO TABS: 500.0000 mg | ORAL_TABLET | Freq: Four times a day (QID) | ORAL | Status: DC | PRN

## 2012-11-03 MED ORDER — OXYCODONE HCL 5 MG PO TABS: 5.0000 mg | ORAL_TABLET | ORAL | Status: DC | PRN

## 2012-11-03 MED FILL — Ropivacaine HCl Inj 5 MG/ML: INTRAMUSCULAR | Qty: 30 | Status: AC

## 2012-11-03 NOTE — Discharge Summary (Signed)
Physician Discharge Summary  Patient ID: REKIA KUJALA MRN: [id number] DOB/AGE: 1956-11-23 56 y.o.  Admit date: 11/02/2012 Discharge date: 11/03/2012  Admission Diagnoses:right patella fx  Discharge Diagnoses: Right partial patellectomy with tendon repair Active Problems:  * No active hospital problems. *    Discharged Condition: good  Hospital Course: O ver night stay for pain control without complications, D/C to home in good condition.  Consults: None  Significant Diagnostic Studies: none  Treatments: IV hydration  Discharge Exam: Blood pressure 125/70, pulse 62, temperature 97.3 F (36.3 C), temperature source Oral, resp. rate 12, height 5\' 8"  (1.727 m), weight 98.431 kg (217 lb), SpO2 100.00%. Cons,alert, comfortable in no distress, right leg dressing intact, thigh and calf soft and nontender, Trom in place, leg NMVI  Disposition: 01-Home or Self Care  Discharge Orders    Future Orders Please Complete By Expires   Diet general      Call MD / Call 911      Comments:   If you experience chest pain or shortness of breath, CALL 911 and be transported to the hospital emergency room.  If you develope a fever above 101 F, pus (white drainage) or increased drainage or redness at the wound, or calf pain, call your surgeon's office.   Increase activity slowly as tolerated      Discharge instructions      Comments:   Patient is to call for followup appointment next Monday in Dr. Thomasena Edis is office patient is to call [phone]. Patient is to keep her T. ROM brace on at all times especially when sleeping and ambulating. Patient is to continue with aspirin 325 mg twice a day with bilateral leg ankle pumps. Patient's continue with icing her knee is much is tolerable to help with discomfort and swelling. Patient is to keep her dressing clean and dry and intact until seen in the office. Patient is not to bend right knee. Patient can call the office if she has any questions or issues.   Driving restrictions      Comments:   No driving.       Medication List     As of 11/03/2012  2:50 PM    TAKE these medications         aspirin 325 MG tablet   Take 325 mg by mouth 2 (two) times daily.      cephALEXin 500 MG capsule   Commonly known as: KEFLEX   Take 500 mg by mouth daily. Used for skin problems  Related to history of shingles      CRANBERRY SOFT PO   Take by mouth daily. Pt takes 84 mg of soft gel cranberry daily      methocarbamol 500 MG tablet   Commonly known as: ROBAXIN   Take 1 tablet (500 mg total) by mouth every 6 (six) hours as needed.      oxyCODONE 5 MG immediate release tablet   Commonly known as: Oxy IR/ROXICODONE   Take 1-2 tablets (5-10 mg total) by mouth every 3 (three) hours as needed.      tetrahydrozoline-zinc 0.05-0.25 % ophthalmic solution   Commonly known as: VISINE-AC   2 drops 3 (three) times daily as needed. Patient uses several times per week      traMADol 50 MG tablet   Commonly known as: ULTRAM   Take 50 mg by mouth every 6 (six) hours as needed. 1-2 tables by mouth 4 times daily  SignedJamelle Rushing 11/03/2012, 2:50 PM

## 2012-11-03 NOTE — Progress Notes (Signed)
Physical Therapy Treatment Note   11/03/12 1400  PT Visit Information  Last PT Received On 11/03/12  Assistance Needed +1  PT Time Calculation  PT Start Time 1400  PT Stop Time 1410  PT Time Calculation (min) 10 min  Subjective Data  Subjective I'm sorry about this morning, I just didnt get any sleep and I was hurting.  Precautions  Precautions Knee  Required Braces or Orthoses Other Brace/Splint  Other Brace/Splint T ROM brace on at all times  Restrictions  Weight Bearing Restrictions Yes  RLE Weight Bearing TWB  Cognition  Overall Cognitive Status Appears within functional limits for tasks assessed/performed  Transfers  Transfers Stand to Sit  Stand to Sit 4: Min guard;With upper extremity assist;To chair/3-in-1  Details for Transfer Assistance pt standing with RN after using BSC upon entering, verbal cues for TDWB  Ambulation/Gait  Ambulation/Gait Assistance 4: Min guard  Ambulation Distance (Feet) 8 Feet (x2)  Assistive device Rolling walker  Ambulation/Gait Assistance Details ambulated to/from stairs, increased verbal cues for TDWB so limited distance, encouraged weight through UEs on RW, verbal cues for sequence  Gait Pattern Step-to pattern;Antalgic  Gait velocity decreased  Stairs Yes  Stairs Assistance 4: Min guard  Stairs Assistance Details (indicate cue type and reason) verbal cues for sequence, TDWB, RW placement, spouse present and educated as well as held RW for pt, given handout for technique  Stair Management Technique Step to pattern;Backwards;With walker  Number of Stairs 2   PT - End of Session  Equipment Utilized During Treatment Gait belt;Other (comment) ((T ROM brace))  Activity Tolerance Patient limited by pain  Patient left in chair;with call bell/phone within reach;with family/visitor present  PT - Assessment/Plan  Comments on Treatment Session Pt ambulated to stairs just outside the door and practiced 2 steps with spouse present.  Pt and spouse  now feel ready for d/c home.  Pt and spouse given stair handout with TDWB status written as well.  PT Plan Discharge plan remains appropriate  Follow Up Recommendations No PT follow up  PT equipment None recommended by PT  Acute Rehab PT Goals  PT Goal: Sit to Stand - Progress Progressing toward goal  PT Goal: Stand to Sit - Progress Progressing toward goal  PT Goal: Ambulate - Progress Progressing toward goal  PT Goal: Up/Down Stairs - Progress Met  PT General Charges  $$ ACUTE PT VISIT 1 Procedure  PT Treatments  $Gait Training 8-22 mins    Zenovia Jarred, PT Pager: [phone]

## 2012-11-03 NOTE — Evaluation (Signed)
Physical Therapy Evaluation Patient Details Name: Samantha Holmes MRN: [id number] DOB: 1956-02-07 Today's Date: 11/03/2012 Time: 4098-1191 PT Time Calculation (min): 15 min  PT Assessment / Plan / Recommendation Clinical Impression  Pt s/p R knee patellectomy.  Pt reports no sleep last night and increased pain limiting mobility.  Pt unaware of TDWB status and required increased encouragement to ambulate prior to d/c.  Eval limited due to dizziness upon standing.  Spouse reports he will be available to assist at home.  Pt would benefit from acute PT services in order to improve independence with transfers, ambulation and stairs while maintaining precautions to prepare for d/c home.    PT Assessment  Patient needs continued PT services    Follow Up Recommendations  No PT follow up (f/u per surgeon)    Does the patient have the potential to tolerate intense rehabilitation      Barriers to Discharge        Equipment Recommendations  None recommended by PT    Recommendations for Other Services     Frequency Min 5X/week    Precautions / Restrictions Precautions Precautions: Knee Required Braces or Orthoses: Other Brace/Splint Other Brace/Splint: T ROM brace on at all times Restrictions Weight Bearing Restrictions: Yes RLE Weight Bearing: Touchdown weight bearing   Pertinent Vitals/Pain Pt reports 10/10 pain R LE, premedicated, repositioned in supine, educated no pillow under knee (causing bend)      Mobility  Bed Mobility Bed Mobility: Supine to Sit;Sit to Supine Supine to Sit: 4: Min assist Sit to Supine: 4: Min guard Details for Bed Mobility Assistance: spouse provided support for R LE as well as pt using L LE to also support Transfers Transfers: Sit to Stand;Stand to Sit Sit to Stand: 4: Min assist;From bed Stand to Sit: 4: Min assist;To bed Details for Transfer Assistance: pt assisted to standing and felt dizzy which did not resolve and she as though she might "pass  out" so returned to supine with assist Ambulation/Gait Ambulation/Gait Assistance: Not tested (comment)    Shoulder Instructions     Exercises     PT Diagnosis: Difficulty walking;Acute pain  PT Problem List: Decreased activity tolerance;Decreased mobility;Decreased safety awareness;Decreased knowledge of precautions;Pain PT Treatment Interventions: DME instruction;Gait training;Stair training;Functional mobility training;Therapeutic activities;Therapeutic exercise;Patient/family education   PT Goals Acute Rehab PT Goals PT Goal Formulation: With patient Time For Goal Achievement: 11/06/12 Potential to Achieve Goals: Good Pt will go Supine/Side to Sit: with modified independence PT Goal: Supine/Side to Sit - Progress: Goal set today Pt will go Sit to Stand: with modified independence PT Goal: Sit to Stand - Progress: Goal set today Pt will go Stand to Sit: with modified independence PT Goal: Stand to Sit - Progress: Goal set today Pt will Ambulate: 16 - 50 feet;with modified independence;with least restrictive assistive device PT Goal: Ambulate - Progress: Goal set today Pt will Go Up / Down Stairs: 1-2 stairs;with min assist;with least restrictive assistive device PT Goal: Up/Down Stairs - Progress: Goal set today  Visit Information  Last PT Received On: 11/03/12 Assistance Needed: +1    Subjective Data  Subjective: It hurts so much. I can't do it.   Prior Functioning  Home Living Lives With: Spouse Type of Home: House Home Access: Stairs to enter Secretary/administrator of Steps: 2 Entrance Stairs-Rails: None Home Layout: One level Home Adaptive Equipment: Walker - rolling;Crutches Prior Function Level of Independence: Independent with assistive device(s) Comments: Pt and spouse report pt has been using RW since  injury a week ago and have performed stairs up with good with RW Communication Communication: No difficulties    Cognition  Overall Cognitive Status:  Appears within functional limits for tasks assessed/performed Arousal/Alertness: Awake/alert Orientation Level: Appears intact for tasks assessed Behavior During Session: [facility] for tasks performed    Extremity/Trunk Assessment Right Upper Extremity Assessment RUE ROM/Strength/Tone: [facility] for tasks assessed Left Upper Extremity Assessment LUE ROM/Strength/Tone: WFL for tasks assessed Right Lower Extremity Assessment RLE ROM/Strength/Tone: Unable to fully assess;Due to precautions Left Lower Extremity Assessment LLE ROM/Strength/Tone: [facility] for tasks assessed   Balance    End of Session PT - End of Session Equipment Utilized During Treatment: Other (comment) (T ROM brace) Activity Tolerance: Patient limited by fatigue;Patient limited by pain Patient left: in bed;with family/visitor present  GP Functional Assessment Tool Used: clinical judgement Functional Limitation: Mobility: Walking and moving around Mobility: Walking and Moving Around Current Status (J4782): At least 20 percent but less than 40 percent impaired, limited or restricted Mobility: Walking and Moving Around Goal Status [phone]): At least 1 percent but less than 20 percent impaired, limited or restricted   Neiman Roots,KATHrine E 11/03/2012, 10:38 AM Pager: [phone]

## 2012-11-03 NOTE — Progress Notes (Signed)
Subjective: Patient feeling pretty good this morning no complaints of shortness breath chest pains or nausea   Objective: Vital signs in last 24 hours: Temp:  [97.7 F (36.5 C)-98.6 F (37 C)] 98.1 F (36.7 C) (12/10 0615) Pulse Rate:  [58-83] 66  (12/10 0615) Resp:  [12-18] 12  (12/10 0615) BP: (100-144)/(49-75) 100/66 mmHg (12/10 0615) SpO2:  [95 %-100 %] 100 % (12/10 0615) Weight:  [98.431 kg (217 lb)] 98.431 kg (217 lb) (12/09 1431)  Intake/Output from previous day: 12/09 0701 - 12/10 0700 In: 2156.3 [I.V.:2156.3] Out: 350 [Urine:350] Intake/Output this shift: Total I/O In: 856.3 [I.V.:856.3] Out: 350 [Urine:350]   Basename 11/02/12 1525  HGB 11.4*    Basename 11/02/12 1525  WBC 8.5  RBC 3.85*  HCT 35.0*  PLT 317    Basename 11/02/12 1525  NA 139  K 4.3  CL 104  CO2 25  BUN 12  CREATININE 0.74  GLUCOSE 114*  CALCIUM 9.7   No results found for this basename: LABPT:2,INR:2 in the last 72 hours  Patient is conscious alert and appropriate sitting up in bed with a T. ROM brace on her right lower extremity.. Her right extremity dressing is clean and dry she has no tenderness in her calf or thigh foot is neuromotor vascularly intact  Assessment/Plan: Postop day #1 status post a right knee patellectomy doing well.  Plan patient will get a physical therapy for touchdown weightbearing instructions use a walker. Patient will be discharged to home with one week followup appointment next Monday.   Jamelle Rushing 11/03/2012, 6:39 AM

## 2012-11-03 NOTE — Progress Notes (Signed)
Utilization review completed.  

## 2013-12-01 DIAGNOSIS — J209 Acute bronchitis, unspecified: Secondary | ICD-10-CM | POA: Diagnosis not present

## 2013-12-01 DIAGNOSIS — J111 Influenza due to unidentified influenza virus with other respiratory manifestations: Secondary | ICD-10-CM | POA: Diagnosis not present

## 2013-12-07 DIAGNOSIS — M999 Biomechanical lesion, unspecified: Secondary | ICD-10-CM | POA: Diagnosis not present

## 2013-12-07 DIAGNOSIS — M5137 Other intervertebral disc degeneration, lumbosacral region: Secondary | ICD-10-CM | POA: Diagnosis not present

## 2013-12-07 DIAGNOSIS — M5430 Sciatica, unspecified side: Secondary | ICD-10-CM | POA: Diagnosis not present

## 2013-12-08 DIAGNOSIS — M5137 Other intervertebral disc degeneration, lumbosacral region: Secondary | ICD-10-CM | POA: Diagnosis not present

## 2013-12-08 DIAGNOSIS — M999 Biomechanical lesion, unspecified: Secondary | ICD-10-CM | POA: Diagnosis not present

## 2013-12-08 DIAGNOSIS — M5430 Sciatica, unspecified side: Secondary | ICD-10-CM | POA: Diagnosis not present

## 2013-12-09 DIAGNOSIS — M5137 Other intervertebral disc degeneration, lumbosacral region: Secondary | ICD-10-CM | POA: Diagnosis not present

## 2013-12-09 DIAGNOSIS — M999 Biomechanical lesion, unspecified: Secondary | ICD-10-CM | POA: Diagnosis not present

## 2013-12-09 DIAGNOSIS — M5430 Sciatica, unspecified side: Secondary | ICD-10-CM | POA: Diagnosis not present

## 2013-12-13 DIAGNOSIS — M5137 Other intervertebral disc degeneration, lumbosacral region: Secondary | ICD-10-CM | POA: Diagnosis not present

## 2013-12-13 DIAGNOSIS — M5430 Sciatica, unspecified side: Secondary | ICD-10-CM | POA: Diagnosis not present

## 2013-12-13 DIAGNOSIS — M999 Biomechanical lesion, unspecified: Secondary | ICD-10-CM | POA: Diagnosis not present

## 2013-12-15 DIAGNOSIS — M5430 Sciatica, unspecified side: Secondary | ICD-10-CM | POA: Diagnosis not present

## 2013-12-15 DIAGNOSIS — M999 Biomechanical lesion, unspecified: Secondary | ICD-10-CM | POA: Diagnosis not present

## 2013-12-15 DIAGNOSIS — M5137 Other intervertebral disc degeneration, lumbosacral region: Secondary | ICD-10-CM | POA: Diagnosis not present

## 2013-12-16 DIAGNOSIS — M5137 Other intervertebral disc degeneration, lumbosacral region: Secondary | ICD-10-CM | POA: Diagnosis not present

## 2013-12-16 DIAGNOSIS — M999 Biomechanical lesion, unspecified: Secondary | ICD-10-CM | POA: Diagnosis not present

## 2013-12-16 DIAGNOSIS — M5430 Sciatica, unspecified side: Secondary | ICD-10-CM | POA: Diagnosis not present

## 2013-12-20 DIAGNOSIS — M5430 Sciatica, unspecified side: Secondary | ICD-10-CM | POA: Diagnosis not present

## 2013-12-20 DIAGNOSIS — M999 Biomechanical lesion, unspecified: Secondary | ICD-10-CM | POA: Diagnosis not present

## 2013-12-20 DIAGNOSIS — M5137 Other intervertebral disc degeneration, lumbosacral region: Secondary | ICD-10-CM | POA: Diagnosis not present

## 2013-12-23 DIAGNOSIS — M999 Biomechanical lesion, unspecified: Secondary | ICD-10-CM | POA: Diagnosis not present

## 2013-12-23 DIAGNOSIS — M5137 Other intervertebral disc degeneration, lumbosacral region: Secondary | ICD-10-CM | POA: Diagnosis not present

## 2013-12-23 DIAGNOSIS — M5430 Sciatica, unspecified side: Secondary | ICD-10-CM | POA: Diagnosis not present

## 2013-12-29 DIAGNOSIS — M5137 Other intervertebral disc degeneration, lumbosacral region: Secondary | ICD-10-CM | POA: Diagnosis not present

## 2013-12-29 DIAGNOSIS — M999 Biomechanical lesion, unspecified: Secondary | ICD-10-CM | POA: Diagnosis not present

## 2013-12-29 DIAGNOSIS — M5430 Sciatica, unspecified side: Secondary | ICD-10-CM | POA: Diagnosis not present

## 2014-01-05 DIAGNOSIS — M999 Biomechanical lesion, unspecified: Secondary | ICD-10-CM | POA: Diagnosis not present

## 2014-01-05 DIAGNOSIS — M5430 Sciatica, unspecified side: Secondary | ICD-10-CM | POA: Diagnosis not present

## 2014-01-05 DIAGNOSIS — M5137 Other intervertebral disc degeneration, lumbosacral region: Secondary | ICD-10-CM | POA: Diagnosis not present

## 2014-10-02 DIAGNOSIS — N3010 Interstitial cystitis (chronic) without hematuria: Secondary | ICD-10-CM | POA: Diagnosis not present

## 2014-10-02 DIAGNOSIS — N3090 Cystitis, unspecified without hematuria: Secondary | ICD-10-CM | POA: Diagnosis not present

## 2015-01-26 DIAGNOSIS — J0100 Acute maxillary sinusitis, unspecified: Secondary | ICD-10-CM | POA: Diagnosis not present

## 2015-03-09 DIAGNOSIS — N3091 Cystitis, unspecified with hematuria: Secondary | ICD-10-CM | POA: Diagnosis not present

## 2015-03-09 DIAGNOSIS — N3001 Acute cystitis with hematuria: Secondary | ICD-10-CM | POA: Diagnosis not present

## 2015-04-05 DIAGNOSIS — N3090 Cystitis, unspecified without hematuria: Secondary | ICD-10-CM | POA: Diagnosis not present

## 2015-04-05 DIAGNOSIS — N3091 Cystitis, unspecified with hematuria: Secondary | ICD-10-CM | POA: Diagnosis not present

## 2015-04-20 DIAGNOSIS — N760 Acute vaginitis: Secondary | ICD-10-CM | POA: Diagnosis not present

## 2015-04-20 DIAGNOSIS — N390 Urinary tract infection, site not specified: Secondary | ICD-10-CM | POA: Diagnosis not present

## 2015-04-26 DIAGNOSIS — L570 Actinic keratosis: Secondary | ICD-10-CM | POA: Diagnosis not present

## 2015-04-26 DIAGNOSIS — L720 Epidermal cyst: Secondary | ICD-10-CM | POA: Diagnosis not present

## 2015-04-26 DIAGNOSIS — L729 Follicular cyst of the skin and subcutaneous tissue, unspecified: Secondary | ICD-10-CM | POA: Diagnosis not present

## 2015-04-26 DIAGNOSIS — L821 Other seborrheic keratosis: Secondary | ICD-10-CM | POA: Diagnosis not present

## 2015-06-29 DIAGNOSIS — N3091 Cystitis, unspecified with hematuria: Secondary | ICD-10-CM | POA: Diagnosis not present

## 2015-07-04 DIAGNOSIS — N390 Urinary tract infection, site not specified: Secondary | ICD-10-CM | POA: Diagnosis not present

## 2015-07-14 DIAGNOSIS — J209 Acute bronchitis, unspecified: Secondary | ICD-10-CM | POA: Diagnosis not present

## 2015-07-14 DIAGNOSIS — J0100 Acute maxillary sinusitis, unspecified: Secondary | ICD-10-CM | POA: Diagnosis not present

## 2015-07-16 ENCOUNTER — Emergency Department (HOSPITAL_COMMUNITY)
Admission: EM | Admit: 2015-07-16 | Discharge: 2015-07-16 | Disposition: A | Discharge status: Home / Self Care | Payer: Medicare Other | Attending: Emergency Medicine | Admitting: Emergency Medicine

## 2015-07-16 ENCOUNTER — Encounter (HOSPITAL_COMMUNITY): Payer: Self-pay | Admitting: Emergency Medicine

## 2015-07-16 ENCOUNTER — Emergency Department (HOSPITAL_COMMUNITY): Payer: Medicare Other

## 2015-07-16 DIAGNOSIS — Z90710 Acquired absence of both cervix and uterus: Secondary | ICD-10-CM | POA: Insufficient documentation

## 2015-07-16 DIAGNOSIS — N3090 Cystitis, unspecified without hematuria: Secondary | ICD-10-CM | POA: Diagnosis not present

## 2015-07-16 DIAGNOSIS — Z8701 Personal history of pneumonia (recurrent): Secondary | ICD-10-CM | POA: Insufficient documentation

## 2015-07-16 DIAGNOSIS — Z79899 Other long term (current) drug therapy: Secondary | ICD-10-CM | POA: Insufficient documentation

## 2015-07-16 DIAGNOSIS — Z8619 Personal history of other infectious and parasitic diseases: Secondary | ICD-10-CM | POA: Insufficient documentation

## 2015-07-16 DIAGNOSIS — Z8679 Personal history of other diseases of the circulatory system: Secondary | ICD-10-CM | POA: Insufficient documentation

## 2015-07-16 DIAGNOSIS — Z8673 Personal history of transient ischemic attack (TIA), and cerebral infarction without residual deficits: Secondary | ICD-10-CM | POA: Diagnosis not present

## 2015-07-16 DIAGNOSIS — Z87828 Personal history of other (healed) physical injury and trauma: Secondary | ICD-10-CM | POA: Insufficient documentation

## 2015-07-16 DIAGNOSIS — Z862 Personal history of diseases of the blood and blood-forming organs and certain disorders involving the immune mechanism: Secondary | ICD-10-CM | POA: Insufficient documentation

## 2015-07-16 DIAGNOSIS — Z720 Tobacco use: Secondary | ICD-10-CM | POA: Insufficient documentation

## 2015-07-16 DIAGNOSIS — R0602 Shortness of breath: Secondary | ICD-10-CM | POA: Diagnosis not present

## 2015-07-16 DIAGNOSIS — R062 Wheezing: Secondary | ICD-10-CM | POA: Insufficient documentation

## 2015-07-16 DIAGNOSIS — R509 Fever, unspecified: Secondary | ICD-10-CM

## 2015-07-16 DIAGNOSIS — R059 Cough, unspecified: Secondary | ICD-10-CM

## 2015-07-16 DIAGNOSIS — Z87440 Personal history of urinary (tract) infections: Secondary | ICD-10-CM | POA: Insufficient documentation

## 2015-07-16 DIAGNOSIS — R05 Cough: Secondary | ICD-10-CM | POA: Diagnosis present

## 2015-07-16 DIAGNOSIS — Z8719 Personal history of other diseases of the digestive system: Secondary | ICD-10-CM | POA: Insufficient documentation

## 2015-07-16 DIAGNOSIS — R6883 Chills (without fever): Secondary | ICD-10-CM | POA: Insufficient documentation

## 2015-07-16 LAB — URINALYSIS, ROUTINE W REFLEX MICROSCOPIC
BILIRUBIN URINE: NEGATIVE
Glucose, UA: NEGATIVE mg/dL
Ketones, ur: NEGATIVE mg/dL
NITRITE: POSITIVE — AB
Protein, ur: 30 mg/dL — AB
SPECIFIC GRAVITY, URINE: 1.011 (ref 1.005–1.030)
UROBILINOGEN UA: 1 mg/dL (ref 0.0–1.0)
pH: 5.5 (ref 5.0–8.0)

## 2015-07-16 LAB — URINE MICROSCOPIC-ADD ON

## 2015-07-16 MED ORDER — CEPHALEXIN 500 MG PO CAPS: 500.0000 mg | ORAL_CAPSULE | Freq: Four times a day (QID) | ORAL | Status: DC

## 2015-07-16 MED ORDER — BENZONATATE 100 MG PO CAPS: 100.0000 mg | ORAL_CAPSULE | Freq: Three times a day (TID) | ORAL | Status: DC

## 2015-07-16 MED ORDER — ALBUTEROL SULFATE HFA 108 (90 BASE) MCG/ACT IN AERS: 1.0000 | INHALATION_SPRAY | Freq: Four times a day (QID) | RESPIRATORY_TRACT | Status: DC | PRN

## 2015-07-16 NOTE — ED Provider Notes (Signed)
CSN: [id number]     Arrival date & time 07/16/15  0543 History   First MD Initiated Contact with Patient 07/16/15 [phone]     Chief Complaint  Patient presents with  . Influenza     (Consider location/radiation/quality/duration/timing/severity/associated sxs/prior Treatment) Patient is a 59 y.o. female presenting with cough.  Cough Cough characteristics:  Non-productive Severity:  Moderate Onset quality:  Gradual Duration:  3 days Timing:  Constant Progression:  Unchanged Chronicity:  New Smoker: yes   Context: sick contacts and upper respiratory infection   Relieved by:  Nothing Worsened by:  Nothing tried Ineffective treatments:  None tried Associated symptoms: chills and sinus congestion   Associated symptoms: no chest pain, no fever and no shortness of breath     Past Medical History  Diagnosis Date  . Fibromyalgia   . Pneumonia     hx of in 2003   . Stroke     hx of ministrokes per pt on CT of head done 2007   . Peripheral vascular disease     varicose veins   . H/O hiatal hernia   . Urinary tract bacterial infections     hx of   . Anemia     hx of as a small child   . Hepatitis     hx of yellow jaundice in 3rd grade   . Pelvic fracture   . 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 Tonsillectomy    . Bladder surgery      as a child " dilate bladder"   . Foot surgery      right   . Cyst on ovary     . Dilation and curettage of uterus    . Abdominal hysterectomy      partial   . Orif patella  11/02/2012    Procedure: OPEN REDUCTION INTERNAL (ORIF) FIXATION PATELLA;  Surgeon: Sydnee Cabal, MD;  Location: [facility] ORS;  Service: Orthopedics;  Laterality: Right;  Partial Patellectomy and repair of extensor mechanism   No family history on file. Social History  Substance Use Topics  . Smoking status: Current Every Day Smoker -- 0.50 packs/day for 30 years    Types:  Cigarettes  . Smokeless tobacco: Never Used  . Alcohol Use: Yes     Comment: rare   OB History    No data available     Review of Systems  Constitutional: Positive for chills. Negative for fever.  Respiratory: Positive for cough. Negative for shortness of breath.   Cardiovascular: Negative for chest pain.  All other systems reviewed and are negative.     Allergies  Ciprofloxacin; Codeine; Cymbalta; Lyrica; Macrodantin; and Sulfa antibiotics  Home Medications   Prior to Admission medications   Medication Sig Start Date End Date Taking? Authorizing Provider  azithromycin (ZITHROMAX) 250 MG tablet Take 250-500 mg by mouth daily. 500 day one, then 250 for days 2-5   Yes Historical Provider, MD  CRANBERRY SOFT PO Take 84 mg by mouth daily. Pt takes 84 mg of soft gel cranberry daily   Yes Historical Provider, MD  dextromethorphan-guaiFENesin (MUCINEX DM) 30-600 MG per 12 hr tablet Take 1 tablet by mouth 2 (two) times daily.   Yes Historical Provider, MD  fexofenadine (ALLEGRA) 180 MG tablet Take 180 mg by mouth daily.   Yes Historical Provider, MD  OVER THE COUNTER MEDICATION  Take 1 tablet by mouth daily. FIBRO[facility]   Yes Historical Provider, MD  phenazopyridine (PYRIDIUM) 97 MG tablet Take 97 mg by mouth 3 (three) times daily as needed for pain.   Yes Historical Provider, MD  saccharomyces boulardii (FLORASTOR) 250 MG capsule Take 250 mg by mouth daily.   Yes Historical Provider, MD  benzonatate (TESSALON) 100 MG capsule Take 1 capsule (100 mg total) by mouth every 8 (eight) hours. 07/16/15   Debby Freiberg, MD  cephALEXin (KEFLEX) 500 MG capsule Take 1 capsule (500 mg total) by mouth 4 (four) times daily. 07/16/15   Debby Freiberg, MD   BP 139/68 mmHg  Pulse 83  Temp(Src) 99.2 F (37.3 C) (Oral)  Resp 21  Ht 5\' 7"  (1.702 m)  Wt 209 lb 10.5 oz (95.1 kg)  BMI 32.83 kg/m2  SpO2 95% Physical Exam  Constitutional: She is oriented to person, place, and time. She appears  well-developed and well-nourished.  HENT:  Head: Normocephalic and atraumatic.  Right Ear: External ear normal.  Left Ear: External ear normal.  Eyes: Conjunctivae and EOM are normal. Pupils are equal, round, and reactive to light.  Neck: Normal range of motion. Neck supple.  Cardiovascular: Normal rate, regular rhythm, normal heart sounds and intact distal pulses.   Pulmonary/Chest: Effort normal. She has wheezes (diffuse).  Abdominal: Soft. Bowel sounds are normal. There is no tenderness.  Musculoskeletal: Normal range of motion.  Neurological: She is alert and oriented to person, place, and time.  Skin: Skin is warm and dry.  Vitals reviewed.   ED Course  Procedures (including critical care time) Labs Review Labs Reviewed  URINALYSIS, ROUTINE W REFLEX MICROSCOPIC (NOT AT [facility]) - Abnormal; Notable for the following:    Color, Urine ORANGE (*)    APPearance TURBID (*)    Hgb urine dipstick LARGE (*)    Protein, ur 30 (*)    Nitrite POSITIVE (*)    Leukocytes, UA LARGE (*)    All other components within normal limits  URINE MICROSCOPIC-ADD ON    Imaging Review Dg Chest 2 View  07/16/2015   CLINICAL DATA:  Cough and fever for 3 days.  Shortness of breath.  EXAM: CHEST  2 VIEW  COMPARISON:  04/21/2013  FINDINGS: The heart size and mediastinal contours are within normal limits. Both lungs are clear. No evidence of pleural effusion. Old right rib fracture deformities again noted.  IMPRESSION: Stable exam.  No active cardiopulmonary disease.   Electronically Signed   By: Earle Gell M.D.   On: 07/16/2015 07:03   I have personally reviewed and evaluated these images and lab results as part of my medical decision-making.   EKG Interpretation None      MDM   Final diagnoses:  Cystitis    59 y.o. female with pertinent PMH of fibromyalgia presents with cough, malaise, chills.  Physical exam as above.  Also complained of dysuria, onset 2 hours PTA.  Wu with negative CXR, UA with  signs of infection.  Possible early pyelo given overall malaise and chills.  Given prescription for keflex, tessalon.  DC home in stable condition  I have reviewed all laboratory and imaging studies if ordered as above  1. Cystitis   2. Cough   3. Fever         Debby Freiberg, MD 07/16/15 [phone]

## 2015-07-16 NOTE — ED Notes (Addendum)
Pt c/o cough, nasal congestion, body aches and fever x 3 days. Pt states her husband had same but his has resolved. Pt states she is unable to eat or sleep. Pt was seen by UC Friday, taking Mucinex DM and Zithromax. Pt also c/o dysuria onset 2 hours ago.

## 2015-07-16 NOTE — ED Notes (Signed)
Awake. Verbally responsive. A/O x4. Resp even and unlabored. No audible adventitious breath sounds noted. ABC's intact.  

## 2015-07-16 NOTE — ED Notes (Signed)
Pt reported having flu-like symptoms x3 days, fever 100.0, occ productive cough of white sputum, nasal congestion, started on ABT from PMD, no hematuria, denies lower abd/back pain, no urinary frequency/urgency, noted cloudy urine without foul odor.

## 2015-07-16 NOTE — Discharge Instructions (Signed)
Urinary Tract Infection Urinary tract infections (UTIs) can develop anywhere along your urinary tract. Your urinary tract is your body's drainage system for removing wastes and extra water. Your urinary tract includes two kidneys, two ureters, a bladder, and a urethra. Your kidneys are a pair of bean-shaped organs. Each kidney is about the size of your fist. They are located below your ribs, one on each side of your spine. CAUSES Infections are caused by microbes, which are microscopic organisms, including fungi, viruses, and bacteria. These organisms are so small that they can only be seen through a microscope. Bacteria are the microbes that most commonly cause UTIs. SYMPTOMS  Symptoms of UTIs may vary by age and gender of the patient and by the location of the infection. Symptoms in young women typically include a frequent and intense urge to urinate and a painful, burning feeling in the bladder or urethra during urination. Older women and men are more likely to be tired, shaky, and weak and have muscle aches and abdominal pain. A fever may mean the infection is in your kidneys. Other symptoms of a kidney infection include pain in your back or sides below the ribs, nausea, and vomiting. DIAGNOSIS To diagnose a UTI, your caregiver will ask you about your symptoms. Your caregiver also will ask to provide a urine sample. The urine sample will be tested for bacteria and white blood cells. White blood cells are made by your body to help fight infection. TREATMENT  Typically, UTIs can be treated with medication. Because most UTIs are caused by a bacterial infection, they usually can be treated with the use of antibiotics. The choice of antibiotic and length of treatment depend on your symptoms and the type of bacteria causing your infection. HOME CARE INSTRUCTIONS  If you were prescribed antibiotics, take them exactly as your caregiver instructs you. Finish the medication even if you feel better after you  have only taken some of the medication.  Drink enough water and fluids to keep your urine clear or pale yellow.  Avoid caffeine, tea, and carbonated beverages. They tend to irritate your bladder.  Empty your bladder often. Avoid holding urine for long periods of time.  Empty your bladder before and after sexual intercourse.  After a bowel movement, women should cleanse from front to back. Use each tissue only once. SEEK MEDICAL CARE IF:   You have back pain.  You develop a fever.  Your symptoms do not begin to resolve within 3 days. SEEK IMMEDIATE MEDICAL CARE IF:   You have severe back pain or lower abdominal pain.  You develop chills.  You have nausea or vomiting.  You have continued burning or discomfort with urination. MAKE SURE YOU:   Understand these instructions.  Will watch your condition.  Will get help right away if you are not doing well or get worse. Document Released: 08/21/2005 Document Revised: 05/12/2012 Document Reviewed: 12/20/2011 [facility] Patient Information 2015 [place], [place]. This information is not intended to replace advice given to you by your health care provider. Make sure you discuss any questions you have with your health care provider. Upper Respiratory Infection, Adult An upper respiratory infection (URI) is also sometimes known as the common cold. The upper respiratory tract includes the nose, sinuses, throat, trachea, and bronchi. Bronchi are the airways leading to the lungs. Most people improve within 1 week, but symptoms can last up to 2 weeks. A residual cough may last even longer.  CAUSES Many different viruses can infect the tissues  lining the upper respiratory tract. The tissues become irritated and inflamed and often become very moist. Mucus production is also common. A cold is contagious. You can easily spread the virus to others by oral contact. This includes kissing, sharing a glass, coughing, or sneezing. Touching your mouth or nose  and then touching a surface, which is then touched by another person, can also spread the virus. SYMPTOMS  Symptoms typically develop 1 to 3 days after you come in contact with a cold virus. Symptoms vary from person to person. They may include:  Runny nose.  Sneezing.  Nasal congestion.  Sinus irritation.  Sore throat.  Loss of voice (laryngitis).  Cough.  Fatigue.  Muscle aches.  Loss of appetite.  Headache.  Low-grade fever. DIAGNOSIS  You might diagnose your own cold based on familiar symptoms, since most people get a cold 2 to 3 times a year. Your caregiver can confirm this based on your exam. Most importantly, your caregiver can check that your symptoms are not due to another disease such as strep throat, sinusitis, pneumonia, asthma, or epiglottitis. Blood tests, throat tests, and X-rays are not necessary to diagnose a common cold, but they may sometimes be helpful in excluding other more serious diseases. Your caregiver will decide if any further tests are required. RISKS AND COMPLICATIONS  You may be at risk for a more severe case of the common cold if you smoke cigarettes, have chronic heart disease (such as heart failure) or lung disease (such as asthma), or if you have a weakened immune system. The very young and very old are also at risk for more serious infections. Bacterial sinusitis, middle ear infections, and bacterial pneumonia can complicate the common cold. The common cold can worsen asthma and chronic obstructive pulmonary disease (COPD). Sometimes, these complications can require emergency medical care and may be life-threatening. PREVENTION  The best way to protect against getting a cold is to practice good hygiene. Avoid oral or hand contact with people with cold symptoms. Wash your hands often if contact occurs. There is no clear evidence that vitamin C, vitamin E, echinacea, or exercise reduces the chance of developing a cold. However, it is always  recommended to get plenty of rest and practice good nutrition. TREATMENT  Treatment is directed at relieving symptoms. There is no cure. Antibiotics are not effective, because the infection is caused by a virus, not by bacteria. Treatment may include:  Increased fluid intake. Sports drinks offer valuable electrolytes, sugars, and fluids.  Breathing heated mist or steam (vaporizer or shower).  Eating chicken soup or other clear broths, and maintaining good nutrition.  Getting plenty of rest.  Using gargles or lozenges for comfort.  Controlling fevers with ibuprofen or acetaminophen as directed by your caregiver.  Increasing usage of your inhaler if you have asthma. Zinc gel and zinc lozenges, taken in the first 24 hours of the common cold, can shorten the duration and lessen the severity of symptoms. Pain medicines may help with fever, muscle aches, and throat pain. A variety of non-prescription medicines are available to treat congestion and runny nose. Your caregiver can make recommendations and may suggest nasal or lung inhalers for other symptoms.  HOME CARE INSTRUCTIONS   Only take over-the-counter or prescription medicines for pain, discomfort, or fever as directed by your caregiver.  Use a warm mist humidifier or inhale steam from a shower to increase air moisture. This may keep secretions moist and make it easier to breathe.  Drink enough water  and fluids to keep your urine clear or pale yellow.  Rest as needed.  Return to work when your temperature has returned to normal or as your caregiver advises. You may need to stay home longer to avoid infecting others. You can also use a face mask and careful hand washing to prevent spread of the virus. SEEK MEDICAL CARE IF:   After the first few days, you feel you are getting worse rather than better.  You need your caregiver's advice about medicines to control symptoms.  You develop chills, worsening shortness of breath, or brown  or red sputum. These may be signs of pneumonia.  You develop yellow or brown nasal discharge or pain in the face, especially when you bend forward. These may be signs of sinusitis.  You develop a fever, swollen neck glands, pain with swallowing, or white areas in the back of your throat. These may be signs of strep throat. SEEK IMMEDIATE MEDICAL CARE IF:   You have a fever.  You develop severe or persistent headache, ear pain, sinus pain, or chest pain.  You develop wheezing, a prolonged cough, cough up blood, or have a change in your usual mucus (if you have chronic lung disease).  You develop sore muscles or a stiff neck. Document Released: 05/07/2001 Document Revised: 02/03/2012 Document Reviewed: 02/16/2014 [facility] Patient Information 2015 [place], [place]. This information is not intended to replace advice given to you by your health care provider. Make sure you discuss any questions you have with your health care provider.

## 2015-07-20 DIAGNOSIS — N390 Urinary tract infection, site not specified: Secondary | ICD-10-CM | POA: Diagnosis not present

## 2015-07-20 DIAGNOSIS — R05 Cough: Secondary | ICD-10-CM | POA: Diagnosis not present

## 2015-10-18 DIAGNOSIS — J0100 Acute maxillary sinusitis, unspecified: Secondary | ICD-10-CM | POA: Diagnosis not present

## 2015-12-16 DIAGNOSIS — M7989 Other specified soft tissue disorders: Secondary | ICD-10-CM | POA: Diagnosis not present

## 2015-12-16 DIAGNOSIS — F17210 Nicotine dependence, cigarettes, uncomplicated: Secondary | ICD-10-CM | POA: Diagnosis not present

## 2015-12-16 DIAGNOSIS — L03115 Cellulitis of right lower limb: Secondary | ICD-10-CM | POA: Diagnosis not present

## 2015-12-20 DIAGNOSIS — L0390 Cellulitis, unspecified: Secondary | ICD-10-CM | POA: Diagnosis not present

## 2015-12-27 DIAGNOSIS — I809 Phlebitis and thrombophlebitis of unspecified site: Secondary | ICD-10-CM | POA: Diagnosis not present

## 2015-12-27 DIAGNOSIS — L309 Dermatitis, unspecified: Secondary | ICD-10-CM | POA: Diagnosis not present

## 2016-01-04 DIAGNOSIS — L821 Other seborrheic keratosis: Secondary | ICD-10-CM | POA: Diagnosis not present

## 2016-01-04 DIAGNOSIS — D2239 Melanocytic nevi of other parts of face: Secondary | ICD-10-CM | POA: Diagnosis not present

## 2016-01-04 DIAGNOSIS — L570 Actinic keratosis: Secondary | ICD-10-CM | POA: Diagnosis not present

## 2016-01-25 DIAGNOSIS — N3090 Cystitis, unspecified without hematuria: Secondary | ICD-10-CM | POA: Diagnosis not present

## 2016-02-12 DIAGNOSIS — N3001 Acute cystitis with hematuria: Secondary | ICD-10-CM | POA: Diagnosis not present

## 2016-02-12 DIAGNOSIS — N3090 Cystitis, unspecified without hematuria: Secondary | ICD-10-CM | POA: Diagnosis not present

## 2016-03-15 DIAGNOSIS — N3091 Cystitis, unspecified with hematuria: Secondary | ICD-10-CM | POA: Diagnosis not present

## 2016-03-15 DIAGNOSIS — N3001 Acute cystitis with hematuria: Secondary | ICD-10-CM | POA: Diagnosis not present

## 2016-03-19 DIAGNOSIS — N952 Postmenopausal atrophic vaginitis: Secondary | ICD-10-CM | POA: Diagnosis not present

## 2016-03-19 DIAGNOSIS — N390 Urinary tract infection, site not specified: Secondary | ICD-10-CM | POA: Diagnosis not present

## 2016-03-20 DIAGNOSIS — R8271 Bacteriuria: Secondary | ICD-10-CM | POA: Diagnosis not present

## 2016-03-20 DIAGNOSIS — Z0000 Encounter for general adult medical examination without abnormal findings: Secondary | ICD-10-CM | POA: Diagnosis not present

## 2016-03-20 DIAGNOSIS — R350 Frequency of micturition: Secondary | ICD-10-CM | POA: Diagnosis not present

## 2016-04-09 DIAGNOSIS — I809 Phlebitis and thrombophlebitis of unspecified site: Secondary | ICD-10-CM | POA: Diagnosis not present

## 2016-04-16 DIAGNOSIS — I739 Peripheral vascular disease, unspecified: Secondary | ICD-10-CM | POA: Diagnosis not present

## 2016-04-16 DIAGNOSIS — I8001 Phlebitis and thrombophlebitis of superficial vessels of right lower extremity: Secondary | ICD-10-CM | POA: Diagnosis not present

## 2016-04-16 DIAGNOSIS — I8391 Asymptomatic varicose veins of right lower extremity: Secondary | ICD-10-CM | POA: Diagnosis not present

## 2016-04-23 DIAGNOSIS — R8271 Bacteriuria: Secondary | ICD-10-CM | POA: Diagnosis not present

## 2016-04-23 DIAGNOSIS — R350 Frequency of micturition: Secondary | ICD-10-CM | POA: Diagnosis not present

## 2016-04-23 DIAGNOSIS — Z0000 Encounter for general adult medical examination without abnormal findings: Secondary | ICD-10-CM | POA: Diagnosis not present

## 2016-04-25 DIAGNOSIS — B373 Candidiasis of vulva and vagina: Secondary | ICD-10-CM | POA: Diagnosis not present

## 2016-04-25 DIAGNOSIS — N390 Urinary tract infection, site not specified: Secondary | ICD-10-CM | POA: Diagnosis not present

## 2016-04-29 DIAGNOSIS — I868 Varicose veins of other specified sites: Secondary | ICD-10-CM | POA: Diagnosis not present

## 2016-04-29 DIAGNOSIS — Z86718 Personal history of other venous thrombosis and embolism: Secondary | ICD-10-CM | POA: Diagnosis not present

## 2016-04-29 DIAGNOSIS — I872 Venous insufficiency (chronic) (peripheral): Secondary | ICD-10-CM | POA: Diagnosis not present

## 2016-05-09 DIAGNOSIS — N3020 Other chronic cystitis without hematuria: Secondary | ICD-10-CM | POA: Diagnosis not present

## 2016-06-05 DIAGNOSIS — N3020 Other chronic cystitis without hematuria: Secondary | ICD-10-CM | POA: Diagnosis not present

## 2016-08-06 DIAGNOSIS — L820 Inflamed seborrheic keratosis: Secondary | ICD-10-CM | POA: Diagnosis not present

## 2016-08-06 DIAGNOSIS — I8311 Varicose veins of right lower extremity with inflammation: Secondary | ICD-10-CM | POA: Diagnosis not present

## 2016-10-07 DIAGNOSIS — N3020 Other chronic cystitis without hematuria: Secondary | ICD-10-CM | POA: Diagnosis not present

## 2016-11-13 IMAGING — CR DG CHEST 2V
2 series · 2 of 2 positions shown · non-contrast
Comparison: 04/21/2013

CLINICAL DATA: Cough and fever for 3 days.  Shortness of breath.

EXAM:
CHEST  2 VIEW

[w chest pa]
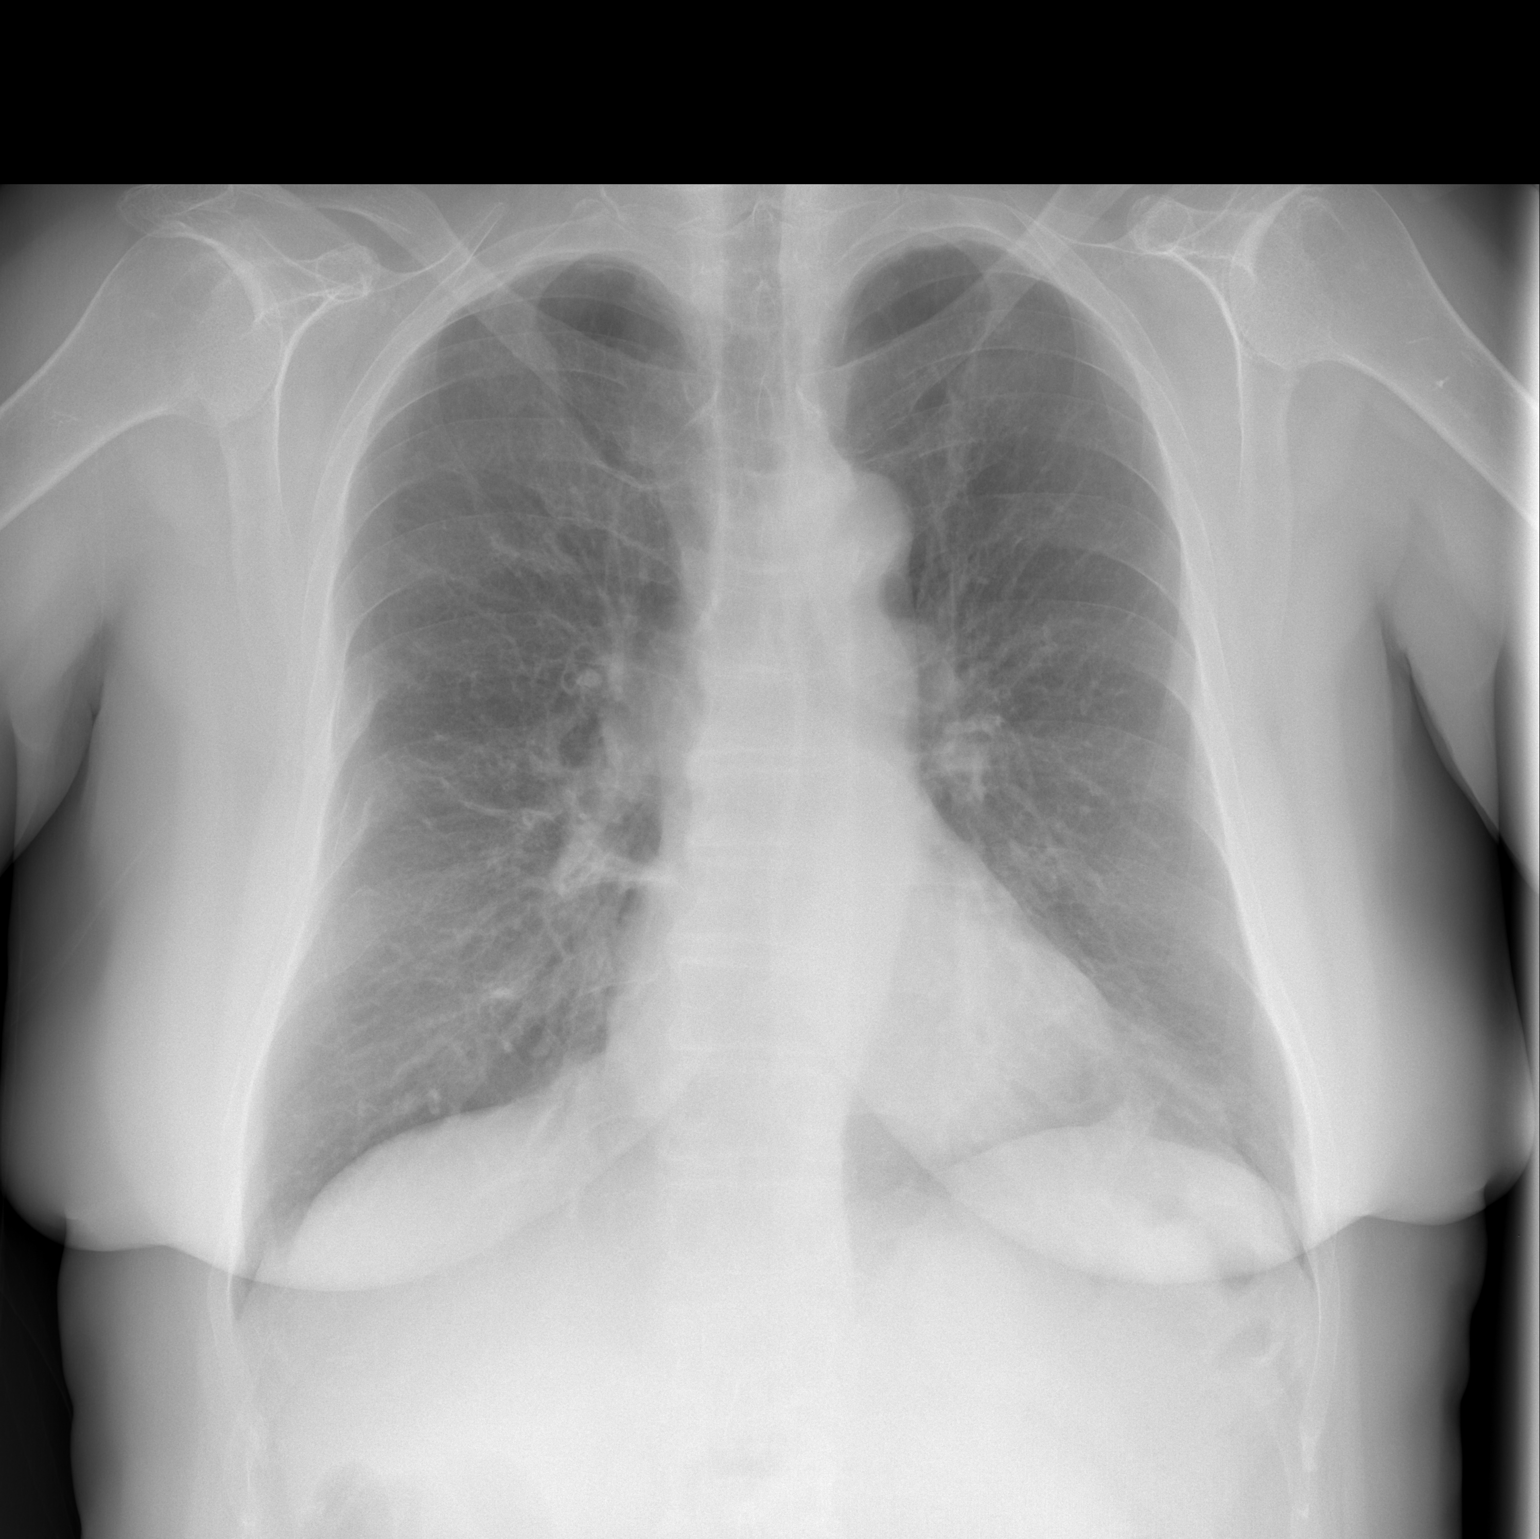

[w chest lat]
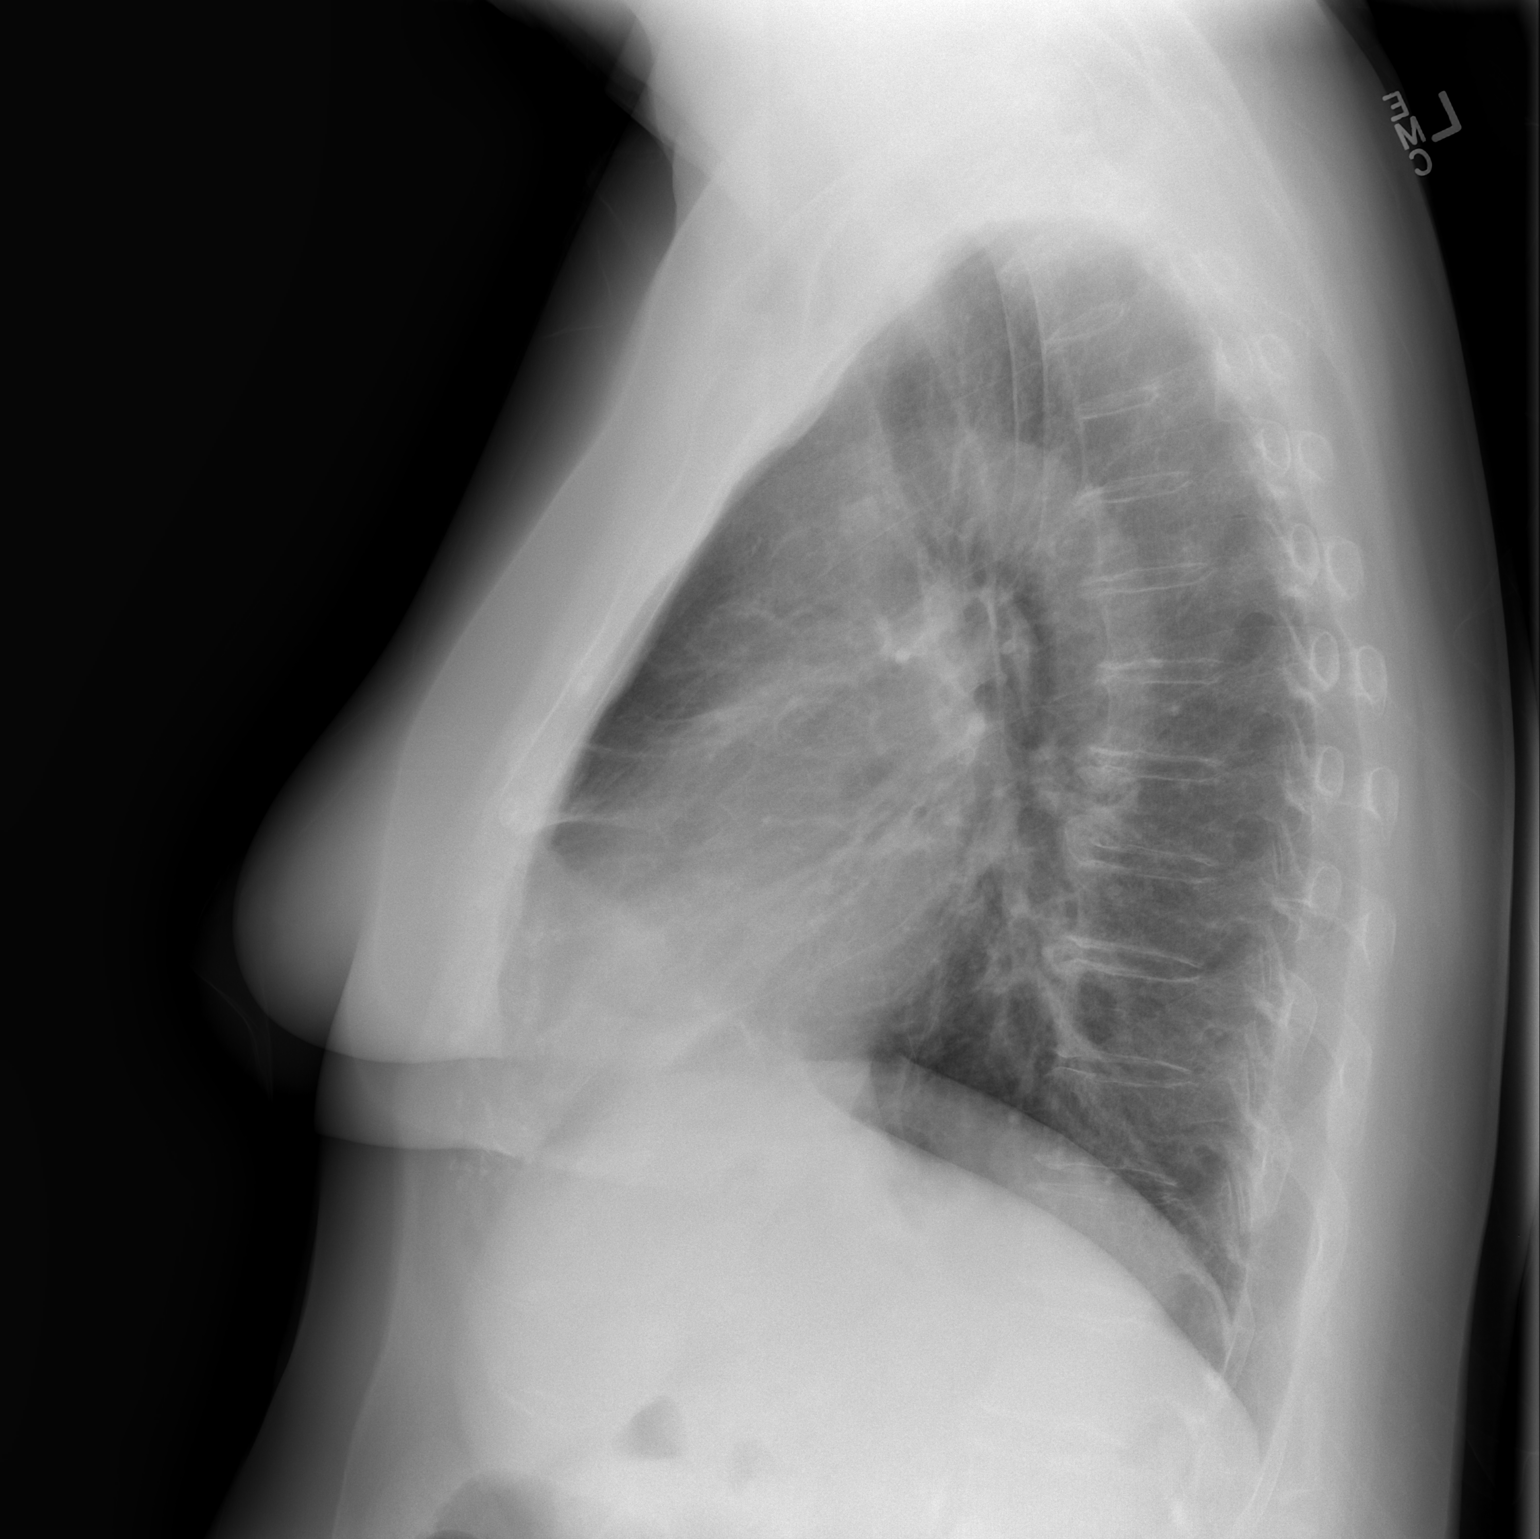

[2 of 2 positions shown; findings below may reference images not displayed]

FINDINGS: The heart size and mediastinal contours are within normal limits.
Both lungs are clear. No evidence of pleural effusion. Old right rib
fracture deformities again noted.
IMPRESSION: Stable exam.  No active cardiopulmonary disease.

## 2016-12-24 DIAGNOSIS — D485 Neoplasm of uncertain behavior of skin: Secondary | ICD-10-CM | POA: Diagnosis not present

## 2016-12-24 DIAGNOSIS — D2362 Other benign neoplasm of skin of left upper limb, including shoulder: Secondary | ICD-10-CM | POA: Diagnosis not present

## 2016-12-24 DIAGNOSIS — L821 Other seborrheic keratosis: Secondary | ICD-10-CM | POA: Diagnosis not present

## 2016-12-24 DIAGNOSIS — D2361 Other benign neoplasm of skin of right upper limb, including shoulder: Secondary | ICD-10-CM | POA: Diagnosis not present

## 2016-12-24 DIAGNOSIS — B078 Other viral warts: Secondary | ICD-10-CM | POA: Diagnosis not present

## 2017-01-28 DIAGNOSIS — J029 Acute pharyngitis, unspecified: Secondary | ICD-10-CM | POA: Diagnosis not present

## 2017-03-19 DIAGNOSIS — N3020 Other chronic cystitis without hematuria: Secondary | ICD-10-CM | POA: Diagnosis not present

## 2017-03-22 DIAGNOSIS — N390 Urinary tract infection, site not specified: Secondary | ICD-10-CM | POA: Diagnosis not present

## 2017-03-22 DIAGNOSIS — N3001 Acute cystitis with hematuria: Secondary | ICD-10-CM | POA: Diagnosis not present

## 2017-04-12 DIAGNOSIS — N3090 Cystitis, unspecified without hematuria: Secondary | ICD-10-CM | POA: Diagnosis not present

## 2017-04-12 DIAGNOSIS — N3001 Acute cystitis with hematuria: Secondary | ICD-10-CM | POA: Diagnosis not present

## 2017-04-22 DIAGNOSIS — N3020 Other chronic cystitis without hematuria: Secondary | ICD-10-CM | POA: Diagnosis not present

## 2017-04-29 DIAGNOSIS — L237 Allergic contact dermatitis due to plants, except food: Secondary | ICD-10-CM | POA: Diagnosis not present

## 2017-05-06 DIAGNOSIS — L309 Dermatitis, unspecified: Secondary | ICD-10-CM | POA: Diagnosis not present

## 2017-05-06 DIAGNOSIS — N390 Urinary tract infection, site not specified: Secondary | ICD-10-CM | POA: Diagnosis not present

## 2017-05-23 DIAGNOSIS — N390 Urinary tract infection, site not specified: Secondary | ICD-10-CM | POA: Diagnosis not present

## 2017-06-10 DIAGNOSIS — N390 Urinary tract infection, site not specified: Secondary | ICD-10-CM | POA: Diagnosis not present

## 2017-07-04 DIAGNOSIS — N390 Urinary tract infection, site not specified: Secondary | ICD-10-CM | POA: Diagnosis not present

## 2017-07-12 DIAGNOSIS — H6503 Acute serous otitis media, bilateral: Secondary | ICD-10-CM | POA: Diagnosis not present

## 2017-08-15 DIAGNOSIS — N390 Urinary tract infection, site not specified: Secondary | ICD-10-CM | POA: Diagnosis not present

## 2017-12-17 DIAGNOSIS — N390 Urinary tract infection, site not specified: Secondary | ICD-10-CM | POA: Diagnosis not present

## 2018-01-13 DIAGNOSIS — L2389 Allergic contact dermatitis due to other agents: Secondary | ICD-10-CM | POA: Diagnosis not present

## 2018-01-14 DIAGNOSIS — H90A21 Sensorineural hearing loss, unilateral, right ear, with restricted hearing on the contralateral side: Secondary | ICD-10-CM | POA: Diagnosis not present

## 2018-01-14 DIAGNOSIS — H608X3 Other otitis externa, bilateral: Secondary | ICD-10-CM | POA: Diagnosis not present

## 2018-01-14 DIAGNOSIS — H90A32 Mixed conductive and sensorineural hearing loss, unilateral, left ear with restricted hearing on the contralateral side: Secondary | ICD-10-CM | POA: Diagnosis not present

## 2018-03-03 DIAGNOSIS — Z87440 Personal history of urinary (tract) infections: Secondary | ICD-10-CM | POA: Diagnosis not present

## 2018-03-03 DIAGNOSIS — N390 Urinary tract infection, site not specified: Secondary | ICD-10-CM | POA: Diagnosis not present

## 2018-03-04 DIAGNOSIS — N390 Urinary tract infection, site not specified: Secondary | ICD-10-CM | POA: Diagnosis not present

## 2018-03-05 DIAGNOSIS — N390 Urinary tract infection, site not specified: Secondary | ICD-10-CM | POA: Diagnosis not present

## 2018-03-05 DIAGNOSIS — B9689 Other specified bacterial agents as the cause of diseases classified elsewhere: Secondary | ICD-10-CM | POA: Diagnosis not present

## 2018-03-11 DIAGNOSIS — R3129 Other microscopic hematuria: Secondary | ICD-10-CM | POA: Diagnosis not present

## 2018-03-11 DIAGNOSIS — N390 Urinary tract infection, site not specified: Secondary | ICD-10-CM | POA: Diagnosis not present

## 2018-03-11 DIAGNOSIS — B9689 Other specified bacterial agents as the cause of diseases classified elsewhere: Secondary | ICD-10-CM | POA: Diagnosis not present

## 2018-03-12 DIAGNOSIS — N390 Urinary tract infection, site not specified: Secondary | ICD-10-CM | POA: Diagnosis not present

## 2018-03-12 DIAGNOSIS — B9689 Other specified bacterial agents as the cause of diseases classified elsewhere: Secondary | ICD-10-CM | POA: Diagnosis not present

## 2018-03-16 DIAGNOSIS — N952 Postmenopausal atrophic vaginitis: Secondary | ICD-10-CM | POA: Diagnosis not present

## 2018-03-16 DIAGNOSIS — N760 Acute vaginitis: Secondary | ICD-10-CM | POA: Diagnosis not present

## 2018-03-19 DIAGNOSIS — R3129 Other microscopic hematuria: Secondary | ICD-10-CM | POA: Diagnosis not present

## 2018-03-31 DIAGNOSIS — N952 Postmenopausal atrophic vaginitis: Secondary | ICD-10-CM | POA: Diagnosis not present

## 2018-04-13 DIAGNOSIS — Z87440 Personal history of urinary (tract) infections: Secondary | ICD-10-CM | POA: Diagnosis not present

## 2018-06-24 DIAGNOSIS — Z87440 Personal history of urinary (tract) infections: Secondary | ICD-10-CM | POA: Diagnosis not present

## 2018-07-03 DIAGNOSIS — N390 Urinary tract infection, site not specified: Secondary | ICD-10-CM | POA: Diagnosis not present

## 2018-10-12 DIAGNOSIS — M797 Fibromyalgia: Secondary | ICD-10-CM | POA: Insufficient documentation

## 2018-10-12 DIAGNOSIS — Z87440 Personal history of urinary (tract) infections: Secondary | ICD-10-CM | POA: Insufficient documentation

## 2018-10-13 DIAGNOSIS — H5202 Hypermetropia, left eye: Secondary | ICD-10-CM | POA: Diagnosis not present

## 2018-10-13 DIAGNOSIS — H53022 Refractive amblyopia, left eye: Secondary | ICD-10-CM | POA: Diagnosis not present

## 2018-11-22 DIAGNOSIS — R5381 Other malaise: Secondary | ICD-10-CM | POA: Diagnosis not present

## 2018-11-22 DIAGNOSIS — S42201A Unspecified fracture of upper end of right humerus, initial encounter for closed fracture: Secondary | ICD-10-CM | POA: Diagnosis not present

## 2018-11-22 DIAGNOSIS — F17200 Nicotine dependence, unspecified, uncomplicated: Secondary | ICD-10-CM | POA: Diagnosis not present

## 2018-11-22 DIAGNOSIS — S01111A Laceration without foreign body of right eyelid and periocular area, initial encounter: Secondary | ICD-10-CM | POA: Diagnosis not present

## 2018-11-22 DIAGNOSIS — I6782 Cerebral ischemia: Secondary | ICD-10-CM | POA: Diagnosis not present

## 2018-11-22 DIAGNOSIS — Z883 Allergy status to other anti-infective agents status: Secondary | ICD-10-CM | POA: Diagnosis not present

## 2018-11-22 DIAGNOSIS — G319 Degenerative disease of nervous system, unspecified: Secondary | ICD-10-CM | POA: Diagnosis not present

## 2018-11-22 DIAGNOSIS — S42291A Other displaced fracture of upper end of right humerus, initial encounter for closed fracture: Secondary | ICD-10-CM | POA: Diagnosis not present

## 2018-11-22 DIAGNOSIS — S2241XA Multiple fractures of ribs, right side, initial encounter for closed fracture: Secondary | ICD-10-CM | POA: Diagnosis not present

## 2018-11-22 DIAGNOSIS — S078XXA Crushing injury of other parts of head, initial encounter: Secondary | ICD-10-CM | POA: Diagnosis not present

## 2018-11-22 DIAGNOSIS — S0990XA Unspecified injury of head, initial encounter: Secondary | ICD-10-CM | POA: Diagnosis not present

## 2018-11-22 DIAGNOSIS — W19XXXA Unspecified fall, initial encounter: Secondary | ICD-10-CM | POA: Diagnosis not present

## 2018-11-22 DIAGNOSIS — S0181XA Laceration without foreign body of other part of head, initial encounter: Secondary | ICD-10-CM | POA: Diagnosis not present

## 2018-11-22 DIAGNOSIS — Z79810 Long term (current) use of selective estrogen receptor modulators (SERMs): Secondary | ICD-10-CM | POA: Diagnosis not present

## 2018-11-22 DIAGNOSIS — Z881 Allergy status to other antibiotic agents status: Secondary | ICD-10-CM | POA: Diagnosis not present

## 2018-11-23 DIAGNOSIS — S42201A Unspecified fracture of upper end of right humerus, initial encounter for closed fracture: Secondary | ICD-10-CM | POA: Insufficient documentation

## 2018-11-23 DIAGNOSIS — M25511 Pain in right shoulder: Secondary | ICD-10-CM | POA: Diagnosis not present

## 2018-12-04 DIAGNOSIS — S42201A Unspecified fracture of upper end of right humerus, initial encounter for closed fracture: Secondary | ICD-10-CM | POA: Diagnosis not present

## 2019-01-04 DIAGNOSIS — M25511 Pain in right shoulder: Secondary | ICD-10-CM | POA: Diagnosis not present

## 2019-01-04 DIAGNOSIS — S42211D Unspecified displaced fracture of surgical neck of right humerus, subsequent encounter for fracture with routine healing: Secondary | ICD-10-CM | POA: Diagnosis not present

## 2019-01-11 DIAGNOSIS — M25511 Pain in right shoulder: Secondary | ICD-10-CM | POA: Diagnosis not present

## 2019-01-11 DIAGNOSIS — M25611 Stiffness of right shoulder, not elsewhere classified: Secondary | ICD-10-CM | POA: Diagnosis not present

## 2019-01-14 DIAGNOSIS — M25611 Stiffness of right shoulder, not elsewhere classified: Secondary | ICD-10-CM | POA: Diagnosis not present

## 2019-01-14 DIAGNOSIS — M25511 Pain in right shoulder: Secondary | ICD-10-CM | POA: Diagnosis not present

## 2019-01-18 DIAGNOSIS — M25511 Pain in right shoulder: Secondary | ICD-10-CM | POA: Diagnosis not present

## 2019-01-18 DIAGNOSIS — M25611 Stiffness of right shoulder, not elsewhere classified: Secondary | ICD-10-CM | POA: Diagnosis not present

## 2019-01-21 DIAGNOSIS — M25611 Stiffness of right shoulder, not elsewhere classified: Secondary | ICD-10-CM | POA: Diagnosis not present

## 2019-01-21 DIAGNOSIS — M25511 Pain in right shoulder: Secondary | ICD-10-CM | POA: Diagnosis not present

## 2019-01-25 DIAGNOSIS — M25611 Stiffness of right shoulder, not elsewhere classified: Secondary | ICD-10-CM | POA: Diagnosis not present

## 2019-01-25 DIAGNOSIS — M25511 Pain in right shoulder: Secondary | ICD-10-CM | POA: Diagnosis not present

## 2019-01-26 DIAGNOSIS — Z87440 Personal history of urinary (tract) infections: Secondary | ICD-10-CM | POA: Diagnosis not present

## 2019-01-26 DIAGNOSIS — Z09 Encounter for follow-up examination after completed treatment for conditions other than malignant neoplasm: Secondary | ICD-10-CM | POA: Diagnosis not present

## 2019-01-28 DIAGNOSIS — M25511 Pain in right shoulder: Secondary | ICD-10-CM | POA: Diagnosis not present

## 2019-01-28 DIAGNOSIS — M25611 Stiffness of right shoulder, not elsewhere classified: Secondary | ICD-10-CM | POA: Diagnosis not present

## 2019-01-31 DIAGNOSIS — L0390 Cellulitis, unspecified: Secondary | ICD-10-CM | POA: Diagnosis not present

## 2019-02-01 DIAGNOSIS — M25611 Stiffness of right shoulder, not elsewhere classified: Secondary | ICD-10-CM | POA: Diagnosis not present

## 2019-02-01 DIAGNOSIS — M25511 Pain in right shoulder: Secondary | ICD-10-CM | POA: Diagnosis not present

## 2019-02-03 DIAGNOSIS — S42201A Unspecified fracture of upper end of right humerus, initial encounter for closed fracture: Secondary | ICD-10-CM | POA: Diagnosis not present

## 2019-02-04 DIAGNOSIS — M25511 Pain in right shoulder: Secondary | ICD-10-CM | POA: Diagnosis not present

## 2019-02-04 DIAGNOSIS — M25611 Stiffness of right shoulder, not elsewhere classified: Secondary | ICD-10-CM | POA: Diagnosis not present

## 2019-02-08 DIAGNOSIS — M25611 Stiffness of right shoulder, not elsewhere classified: Secondary | ICD-10-CM | POA: Diagnosis not present

## 2019-02-08 DIAGNOSIS — M25511 Pain in right shoulder: Secondary | ICD-10-CM | POA: Diagnosis not present

## 2019-02-10 DIAGNOSIS — M25511 Pain in right shoulder: Secondary | ICD-10-CM | POA: Diagnosis not present

## 2019-02-10 DIAGNOSIS — M25611 Stiffness of right shoulder, not elsewhere classified: Secondary | ICD-10-CM | POA: Diagnosis not present

## 2019-02-15 DIAGNOSIS — M25611 Stiffness of right shoulder, not elsewhere classified: Secondary | ICD-10-CM | POA: Diagnosis not present

## 2019-02-15 DIAGNOSIS — M25511 Pain in right shoulder: Secondary | ICD-10-CM | POA: Diagnosis not present

## 2019-02-18 DIAGNOSIS — M25511 Pain in right shoulder: Secondary | ICD-10-CM | POA: Diagnosis not present

## 2019-02-18 DIAGNOSIS — M25611 Stiffness of right shoulder, not elsewhere classified: Secondary | ICD-10-CM | POA: Diagnosis not present

## 2019-02-22 DIAGNOSIS — M25611 Stiffness of right shoulder, not elsewhere classified: Secondary | ICD-10-CM | POA: Diagnosis not present

## 2019-02-22 DIAGNOSIS — M25511 Pain in right shoulder: Secondary | ICD-10-CM | POA: Diagnosis not present

## 2019-02-25 DIAGNOSIS — M25611 Stiffness of right shoulder, not elsewhere classified: Secondary | ICD-10-CM | POA: Diagnosis not present

## 2019-02-25 DIAGNOSIS — M25511 Pain in right shoulder: Secondary | ICD-10-CM | POA: Diagnosis not present

## 2019-03-01 DIAGNOSIS — M25611 Stiffness of right shoulder, not elsewhere classified: Secondary | ICD-10-CM | POA: Diagnosis not present

## 2019-03-01 DIAGNOSIS — M25511 Pain in right shoulder: Secondary | ICD-10-CM | POA: Diagnosis not present

## 2019-03-04 DIAGNOSIS — M25511 Pain in right shoulder: Secondary | ICD-10-CM | POA: Diagnosis not present

## 2019-03-04 DIAGNOSIS — M25611 Stiffness of right shoulder, not elsewhere classified: Secondary | ICD-10-CM | POA: Diagnosis not present

## 2019-03-15 DIAGNOSIS — M25611 Stiffness of right shoulder, not elsewhere classified: Secondary | ICD-10-CM | POA: Diagnosis not present

## 2019-03-15 DIAGNOSIS — M25511 Pain in right shoulder: Secondary | ICD-10-CM | POA: Diagnosis not present

## 2019-03-18 DIAGNOSIS — M25611 Stiffness of right shoulder, not elsewhere classified: Secondary | ICD-10-CM | POA: Diagnosis not present

## 2019-03-18 DIAGNOSIS — M25511 Pain in right shoulder: Secondary | ICD-10-CM | POA: Diagnosis not present

## 2019-03-19 DIAGNOSIS — S42201D Unspecified fracture of upper end of right humerus, subsequent encounter for fracture with routine healing: Secondary | ICD-10-CM | POA: Diagnosis not present

## 2019-03-24 DIAGNOSIS — M25511 Pain in right shoulder: Secondary | ICD-10-CM | POA: Diagnosis not present

## 2019-03-24 DIAGNOSIS — M25611 Stiffness of right shoulder, not elsewhere classified: Secondary | ICD-10-CM | POA: Diagnosis not present

## 2019-03-31 DIAGNOSIS — M25611 Stiffness of right shoulder, not elsewhere classified: Secondary | ICD-10-CM | POA: Diagnosis not present

## 2019-03-31 DIAGNOSIS — M25511 Pain in right shoulder: Secondary | ICD-10-CM | POA: Diagnosis not present

## 2019-04-07 DIAGNOSIS — M25511 Pain in right shoulder: Secondary | ICD-10-CM | POA: Diagnosis not present

## 2019-04-07 DIAGNOSIS — M25611 Stiffness of right shoulder, not elsewhere classified: Secondary | ICD-10-CM | POA: Diagnosis not present

## 2019-04-15 DIAGNOSIS — M25511 Pain in right shoulder: Secondary | ICD-10-CM | POA: Diagnosis not present

## 2019-04-15 DIAGNOSIS — M25611 Stiffness of right shoulder, not elsewhere classified: Secondary | ICD-10-CM | POA: Diagnosis not present

## 2019-04-22 DIAGNOSIS — M25611 Stiffness of right shoulder, not elsewhere classified: Secondary | ICD-10-CM | POA: Diagnosis not present

## 2019-04-22 DIAGNOSIS — M25511 Pain in right shoulder: Secondary | ICD-10-CM | POA: Diagnosis not present

## 2019-05-21 DIAGNOSIS — L508 Other urticaria: Secondary | ICD-10-CM | POA: Diagnosis not present

## 2019-06-03 DIAGNOSIS — L508 Other urticaria: Secondary | ICD-10-CM | POA: Diagnosis not present

## 2019-07-15 DIAGNOSIS — L720 Epidermal cyst: Secondary | ICD-10-CM | POA: Diagnosis not present

## 2019-07-15 DIAGNOSIS — L732 Hidradenitis suppurativa: Secondary | ICD-10-CM | POA: Diagnosis not present

## 2019-07-15 DIAGNOSIS — L508 Other urticaria: Secondary | ICD-10-CM | POA: Diagnosis not present

## 2019-09-30 DIAGNOSIS — D692 Other nonthrombocytopenic purpura: Secondary | ICD-10-CM | POA: Diagnosis not present

## 2019-09-30 DIAGNOSIS — C44622 Squamous cell carcinoma of skin of right upper limb, including shoulder: Secondary | ICD-10-CM | POA: Diagnosis not present

## 2020-01-03 DIAGNOSIS — M797 Fibromyalgia: Secondary | ICD-10-CM | POA: Diagnosis not present

## 2020-01-03 DIAGNOSIS — N952 Postmenopausal atrophic vaginitis: Secondary | ICD-10-CM | POA: Insufficient documentation

## 2020-01-03 DIAGNOSIS — Z7689 Persons encountering health services in other specified circumstances: Secondary | ICD-10-CM | POA: Diagnosis not present

## 2020-01-03 DIAGNOSIS — J069 Acute upper respiratory infection, unspecified: Secondary | ICD-10-CM | POA: Diagnosis not present

## 2020-01-03 DIAGNOSIS — I10 Essential (primary) hypertension: Secondary | ICD-10-CM | POA: Diagnosis not present

## 2020-01-11 DIAGNOSIS — Z1231 Encounter for screening mammogram for malignant neoplasm of breast: Secondary | ICD-10-CM | POA: Diagnosis not present

## 2020-01-11 DIAGNOSIS — M797 Fibromyalgia: Secondary | ICD-10-CM | POA: Diagnosis not present

## 2020-01-11 DIAGNOSIS — J069 Acute upper respiratory infection, unspecified: Secondary | ICD-10-CM | POA: Diagnosis not present

## 2020-01-11 DIAGNOSIS — Z0000 Encounter for general adult medical examination without abnormal findings: Secondary | ICD-10-CM | POA: Diagnosis not present

## 2020-01-11 DIAGNOSIS — I10 Essential (primary) hypertension: Secondary | ICD-10-CM | POA: Diagnosis not present

## 2020-01-25 DIAGNOSIS — M797 Fibromyalgia: Secondary | ICD-10-CM | POA: Diagnosis not present

## 2020-01-25 DIAGNOSIS — E782 Mixed hyperlipidemia: Secondary | ICD-10-CM | POA: Diagnosis not present

## 2020-01-25 DIAGNOSIS — R7982 Elevated C-reactive protein (CRP): Secondary | ICD-10-CM | POA: Diagnosis not present

## 2020-01-25 DIAGNOSIS — C44622 Squamous cell carcinoma of skin of right upper limb, including shoulder: Secondary | ICD-10-CM | POA: Insufficient documentation

## 2020-01-25 DIAGNOSIS — J301 Allergic rhinitis due to pollen: Secondary | ICD-10-CM | POA: Diagnosis not present

## 2020-01-25 DIAGNOSIS — E669 Obesity, unspecified: Secondary | ICD-10-CM | POA: Diagnosis not present

## 2020-01-25 DIAGNOSIS — E559 Vitamin D deficiency, unspecified: Secondary | ICD-10-CM | POA: Insufficient documentation

## 2020-02-25 DIAGNOSIS — E669 Obesity, unspecified: Secondary | ICD-10-CM | POA: Diagnosis not present

## 2020-02-25 DIAGNOSIS — M797 Fibromyalgia: Secondary | ICD-10-CM | POA: Diagnosis not present

## 2020-02-25 DIAGNOSIS — Z9189 Other specified personal risk factors, not elsewhere classified: Secondary | ICD-10-CM | POA: Diagnosis not present

## 2020-02-25 DIAGNOSIS — R5383 Other fatigue: Secondary | ICD-10-CM | POA: Diagnosis not present

## 2020-03-13 DIAGNOSIS — Z87440 Personal history of urinary (tract) infections: Secondary | ICD-10-CM | POA: Diagnosis not present

## 2020-04-04 DIAGNOSIS — M797 Fibromyalgia: Secondary | ICD-10-CM | POA: Diagnosis not present

## 2020-04-04 DIAGNOSIS — R5383 Other fatigue: Secondary | ICD-10-CM | POA: Diagnosis not present

## 2020-04-04 DIAGNOSIS — Z6833 Body mass index (BMI) 33.0-33.9, adult: Secondary | ICD-10-CM | POA: Diagnosis not present

## 2020-04-04 DIAGNOSIS — E559 Vitamin D deficiency, unspecified: Secondary | ICD-10-CM | POA: Diagnosis not present

## 2020-04-04 DIAGNOSIS — E669 Obesity, unspecified: Secondary | ICD-10-CM | POA: Diagnosis not present

## 2020-04-06 DIAGNOSIS — E669 Obesity, unspecified: Secondary | ICD-10-CM | POA: Insufficient documentation

## 2020-06-16 DIAGNOSIS — E538 Deficiency of other specified B group vitamins: Secondary | ICD-10-CM | POA: Diagnosis not present

## 2020-06-26 DIAGNOSIS — L920 Granuloma annulare: Secondary | ICD-10-CM | POA: Diagnosis not present

## 2020-06-26 DIAGNOSIS — L570 Actinic keratosis: Secondary | ICD-10-CM | POA: Diagnosis not present

## 2020-06-26 DIAGNOSIS — D044 Carcinoma in situ of skin of scalp and neck: Secondary | ICD-10-CM | POA: Diagnosis not present

## 2020-07-17 DIAGNOSIS — R5383 Other fatigue: Secondary | ICD-10-CM | POA: Diagnosis not present

## 2020-07-17 DIAGNOSIS — M797 Fibromyalgia: Secondary | ICD-10-CM | POA: Diagnosis not present

## 2020-08-15 DIAGNOSIS — E559 Vitamin D deficiency, unspecified: Secondary | ICD-10-CM | POA: Diagnosis not present

## 2020-08-15 DIAGNOSIS — R5383 Other fatigue: Secondary | ICD-10-CM | POA: Diagnosis not present

## 2020-08-15 DIAGNOSIS — E538 Deficiency of other specified B group vitamins: Secondary | ICD-10-CM | POA: Diagnosis not present

## 2020-08-15 DIAGNOSIS — E669 Obesity, unspecified: Secondary | ICD-10-CM | POA: Diagnosis not present

## 2020-08-15 DIAGNOSIS — Z0000 Encounter for general adult medical examination without abnormal findings: Secondary | ICD-10-CM | POA: Diagnosis not present

## 2020-08-15 DIAGNOSIS — M797 Fibromyalgia: Secondary | ICD-10-CM | POA: Diagnosis not present

## 2020-09-07 DIAGNOSIS — N390 Urinary tract infection, site not specified: Secondary | ICD-10-CM | POA: Diagnosis not present

## 2020-09-08 DIAGNOSIS — N3000 Acute cystitis without hematuria: Secondary | ICD-10-CM | POA: Diagnosis not present

## 2020-09-15 DIAGNOSIS — E538 Deficiency of other specified B group vitamins: Secondary | ICD-10-CM | POA: Diagnosis not present

## 2020-09-19 DIAGNOSIS — D485 Neoplasm of uncertain behavior of skin: Secondary | ICD-10-CM | POA: Diagnosis not present

## 2020-09-19 DIAGNOSIS — L905 Scar conditions and fibrosis of skin: Secondary | ICD-10-CM | POA: Diagnosis not present

## 2020-09-19 DIAGNOSIS — L570 Actinic keratosis: Secondary | ICD-10-CM | POA: Diagnosis not present

## 2020-10-17 DIAGNOSIS — E538 Deficiency of other specified B group vitamins: Secondary | ICD-10-CM | POA: Diagnosis not present

## 2020-10-28 DIAGNOSIS — Z20828 Contact with and (suspected) exposure to other viral communicable diseases: Secondary | ICD-10-CM | POA: Diagnosis not present

## 2020-10-28 DIAGNOSIS — R519 Headache, unspecified: Secondary | ICD-10-CM | POA: Diagnosis not present

## 2020-10-28 DIAGNOSIS — R509 Fever, unspecified: Secondary | ICD-10-CM | POA: Diagnosis not present

## 2020-11-07 DIAGNOSIS — D1801 Hemangioma of skin and subcutaneous tissue: Secondary | ICD-10-CM | POA: Diagnosis not present

## 2020-11-07 DIAGNOSIS — Z86008 Personal history of in-situ neoplasm of other site: Secondary | ICD-10-CM | POA: Diagnosis not present

## 2020-11-07 DIAGNOSIS — Z859 Personal history of malignant neoplasm, unspecified: Secondary | ICD-10-CM | POA: Diagnosis not present

## 2020-11-07 DIAGNOSIS — L821 Other seborrheic keratosis: Secondary | ICD-10-CM | POA: Diagnosis not present

## 2020-11-07 DIAGNOSIS — C44629 Squamous cell carcinoma of skin of left upper limb, including shoulder: Secondary | ICD-10-CM | POA: Diagnosis not present

## 2020-11-16 DIAGNOSIS — M797 Fibromyalgia: Secondary | ICD-10-CM | POA: Diagnosis not present

## 2020-11-16 DIAGNOSIS — R5383 Other fatigue: Secondary | ICD-10-CM | POA: Diagnosis not present

## 2021-05-09 DIAGNOSIS — S71109A Unspecified open wound, unspecified thigh, initial encounter: Secondary | ICD-10-CM | POA: Insufficient documentation

## 2021-05-09 DIAGNOSIS — L03115 Cellulitis of right lower limb: Secondary | ICD-10-CM | POA: Insufficient documentation

## 2021-05-14 DIAGNOSIS — I872 Venous insufficiency (chronic) (peripheral): Secondary | ICD-10-CM | POA: Insufficient documentation

## 2021-05-14 DIAGNOSIS — E538 Deficiency of other specified B group vitamins: Secondary | ICD-10-CM | POA: Insufficient documentation

## 2021-11-14 ENCOUNTER — Other Ambulatory Visit: Payer: Self-pay

## 2021-11-14 ENCOUNTER — Ambulatory Visit (INDEPENDENT_AMBULATORY_CARE_PROVIDER_SITE_OTHER): Payer: Medicare Other | Admitting: Cardiology

## 2021-11-14 ENCOUNTER — Encounter: Payer: Self-pay | Admitting: Cardiology

## 2021-11-14 VITALS — BP 108/62 | HR 109 | Ht 68.0 in | Wt 189.0 lb

## 2021-11-14 DIAGNOSIS — I4891 Unspecified atrial fibrillation: Secondary | ICD-10-CM

## 2021-11-14 DIAGNOSIS — E785 Hyperlipidemia, unspecified: Secondary | ICD-10-CM | POA: Diagnosis not present

## 2021-11-14 DIAGNOSIS — I4819 Other persistent atrial fibrillation: Secondary | ICD-10-CM | POA: Diagnosis not present

## 2021-11-14 DIAGNOSIS — R0609 Other forms of dyspnea: Secondary | ICD-10-CM | POA: Diagnosis not present

## 2021-11-14 DIAGNOSIS — S069XAA Unspecified intracranial injury with loss of consciousness status unknown, initial encounter: Secondary | ICD-10-CM | POA: Insufficient documentation

## 2021-11-14 MED ORDER — RIVAROXABAN 20 MG PO TABS: 20.0000 mg | ORAL_TABLET | Freq: Every day | ORAL | 12 refills | Status: DC

## 2021-11-14 MED ORDER — DILTIAZEM HCL ER COATED BEADS 180 MG PO CP24: 180.0000 mg | ORAL_CAPSULE | Freq: Every day | ORAL | 3 refills | Status: AC

## 2021-11-14 NOTE — Patient Instructions (Signed)
Medication Instructions:  Your physician has recommended you make the following change in your medication:   Stop Eliquis Start Xarelto 20 mg daily. Start Cardizem CD 180 mg daily.  *If you need a refill on your cardiac medications before your next appointment, please call your pharmacy*   Lab Work: None ordered If you have labs (blood work) drawn today and your tests are completely normal, you will receive your results only by: [place] (if you have MyChart) OR A paper copy in the mail If you have any lab test that is abnormal or we need to change your treatment, we will call you to review the results.   Testing/Procedures: Your physician has requested that you have an echocardiogram. Echocardiography is a painless test that uses sound waves to create images of your heart. It provides your doctor with information about the size and shape of your heart and how well your hearts chambers and valves are working. This procedure takes approximately one hour. There are no restrictions for this procedure.    Follow-Up: At [facility], you and your health needs are our priority.  As part of our continuing mission to provide you with exceptional heart care, we have created designated Provider Care Teams.  These Care Teams include your primary Cardiologist (physician) and Advanced Practice Providers (APPs -  Physician Assistants and Nurse Practitioners) who all work together to provide you with the care you need, when you need it.  We recommend signing up for the patient portal called "MyChart".  Sign up information is provided on this After Visit Summary.  MyChart is used to connect with patients for Virtual Visits (Telemedicine).  Patients are able to view lab/test results, encounter notes, upcoming appointments, etc.  Non-urgent messages can be sent to your provider as well.   To learn more about what you can do with MyChart, go to [email or web address].    Your next appointment:    1 month(s)  The format for your next appointment:   In Person  Provider:   Jenne Campus, MD   Other Instructions Echocardiogram An echocardiogram is a test that uses sound waves (ultrasound) to produce images of the heart. Images from an echocardiogram can provide important information about: Heart size and shape. The size and thickness and movement of your heart's walls. Heart muscle function and strength. Heart valve function or if you have stenosis. Stenosis is when the heart valves are too narrow. If blood is flowing backward through the heart valves (regurgitation). A tumor or infectious growth around the heart valves. Areas of heart muscle that are not working well because of poor blood flow or injury from a heart attack. Aneurysm detection. An aneurysm is a weak or damaged part of an artery wall. The wall bulges out from the normal force of blood pumping through the body. Tell a health care provider about: Any allergies you have. All medicines you are taking, including vitamins, herbs, eye drops, creams, and over-the-counter medicines. Any blood disorders you have. Any surgeries you have had. Any medical conditions you have. Whether you are pregnant or may be pregnant. What are the risks? Generally, this is a safe test. However, problems may occur, including an allergic reaction to dye (contrast) that may be used during the test. What happens before the test? No specific preparation is needed. You may eat and drink normally. What happens during the test? You will take off your clothes from the waist up and put on a hospital gown. Electrodes or electrocardiogram (ECG)patches  may be placed on your chest. The electrodes or patches are then connected to a device that monitors your heart rate and rhythm. You will lie down on a table for an ultrasound exam. A gel will be applied to your chest to help sound waves pass through your skin. A handheld device, called a  transducer, will be pressed against your chest and moved over your heart. The transducer produces sound waves that travel to your heart and bounce back (or "echo" back) to the transducer. These sound waves will be captured in real-time and changed into images of your heart that can be viewed on a video monitor. The images will be recorded on a computer and reviewed by your health care provider. You may be asked to change positions or hold your breath for a short time. This makes it easier to get different views or better views of your heart. In some cases, you may receive contrast through an IV in one of your veins. This can improve the quality of the pictures from your heart. The procedure may vary among health care providers and hospitals.   What can I expect after the test? You may return to your normal, everyday life, including diet, activities, and medicines, unless your health care provider tells you not to do that. Follow these instructions at home: It is up to you to get the results of your test. Ask your health care provider, or the department that is doing the test, when your results will be ready. Keep all follow-up visits. This is important. Summary An echocardiogram is a test that uses sound waves (ultrasound) to produce images of the heart. Images from an echocardiogram can provide important information about the size and shape of your heart, heart muscle function, heart valve function, and other possible heart problems. You do not need to do anything to prepare before this test. You may eat and drink normally. After the echocardiogram is completed, you may return to your normal, everyday life, unless your health care provider tells you not to do that. This information is not intended to replace advice given to you by your health care provider. Make sure you discuss any questions you have with your health care provider. Document Revised: 07/04/2020 Document Reviewed: 07/04/2020 Elsevier  Patient Education  2021 [employer].

## 2021-11-14 NOTE — Progress Notes (Signed)
Cardiology Consultation:    Date:  11/14/2021   ID:  Samantha Holmes, DOB 1956/04/29, MRN [id number]  PCP:  Dineen Kid, MD  Cardiologist:  Jenne Campus, MD   Referring MD: Anda Kraft, MD   Chief Complaint  Patient presents with   Atrial Fibrillation    History of Present Illness:    Samantha Holmes is a 65 y.o. female who is being seen today for the evaluation of atrial fibrillation at the request of Anda Kraft, MD. apparently her husband became sick he did have upper respiratory tract infection, he was checked for COVID which was negative then couple days later his wife started feeling sick and she decided to go to urgent care.  She was find to have tachycardia she was referred to the emergency room.  In the emergency room she was found to be in atrial fibrillation fast ventricular rate.  Her CHADS2 Vascor is only 2 for being more than 65 in a woman.  She was anticoagulated Eliquis however she took first dose yesterday and started having some shortness of breath after that.  She was also given AV blocking agent in the form of Cardizem 120 which she is able to tolerate quite well.  Overall she does not feel her atrial fibrillation she does not have any palpitations. She does have any chest pain tightness squeezing pressure burning chest she did have some shortness of breath with exercise but that is improved. 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 - no surgery - 21 staples placed    Fibromyalgia    H/O hiatal hernia    Hepatitis    hx of yellow jaundice in 3rd grade    Pelvic fracture (HCC)    Peripheral vascular disease (HCC)    varicose veins    Pneumonia    hx of in 2003    Stroke ([place])    hx of ministrokes per pt on CT of head done 2007    Urinary tract bacterial infections    hx of     Past Surgical History:   Procedure Laterality Date   ABDOMINAL HYSTERECTOMY     partial    BLADDER SURGERY     as a child " dilate bladder"    cyst on ovary      DILATION AND CURETTAGE OF UTERUS     FOOT SURGERY     right    ORIF PATELLA  11/02/2012   Procedure: OPEN REDUCTION INTERNAL (ORIF) FIXATION PATELLA;  Surgeon: Sydnee Cabal, MD;  Location: [facility];  Service: Orthopedics;  Laterality: Right;  Partial Patellectomy and repair of extensor mechanism   TONSILLECTOMY      Current Medications: Current Meds  Medication Sig   cholecalciferol (VITAMIN D3) 25 MCG (1000 UNIT) tablet Take 3,000 Units by mouth daily.   diltiazem (CARDIZEM CD) 180 MG 24 hr capsule Take 1 capsule (180 mg total) by mouth daily.   doxycycline (VIBRAMYCIN) 100 MG capsule Take 100 mg by mouth 2 (two) times daily.   PREMARIN vaginal cream Place 1 applicator vaginally once a week. [place]   rivaroxaban (XARELTO) 20 MG TABS tablet Take 1 tablet (20 mg total) by mouth daily with supper.   [DISCONTINUED] albuterol (PROVENTIL HFA;VENTOLIN HFA) 108 (90 BASE) MCG/ACT inhaler Inhale 1-2  puffs into the lungs every 6 (six) hours as needed for wheezing or shortness of breath.   [DISCONTINUED] azithromycin (ZITHROMAX) 250 MG tablet Take 250-500 mg by mouth daily. 500 day one, then 250 for days 2-5   [DISCONTINUED] benzonatate (TESSALON) 100 MG capsule Take 1 capsule (100 mg total) by mouth every 8 (eight) hours.   [DISCONTINUED] cephALEXin (KEFLEX) 500 MG capsule Take 1 capsule (500 mg total) by mouth 4 (four) times daily.   [DISCONTINUED] CRANBERRY SOFT PO Take 84 mg by mouth daily. Pt takes 84 mg of soft gel cranberry daily   [DISCONTINUED] cyanocobalamin (,VITAMIN B-12,) 1000 MCG/ML injection Inject 1,000 mcg into the muscle every 30 (thirty) days.   [DISCONTINUED] dextromethorphan-guaiFENesin (MUCINEX DM) 30-600 MG per 12 hr tablet Take 1 tablet by mouth 2 (two) times daily.   [DISCONTINUED] diltiazem (CARDIZEM CD) 120 MG 24 hr capsule Take 120 mg by  mouth daily.   [DISCONTINUED] doxycycline (VIBRAMYCIN) 100 MG capsule    [DISCONTINUED] ELIQUIS 5 MG TABS tablet Take 5 mg by mouth 2 (two) times daily.   [DISCONTINUED] fexofenadine (ALLEGRA) 180 MG tablet Take 180 mg by mouth daily.   [DISCONTINUED] OVER THE COUNTER MEDICATION Take 1 tablet by mouth daily. [facility] / [place]   [DISCONTINUED] phenazopyridine (PYRIDIUM) 97 MG tablet Take 97 mg by mouth 3 (three) times daily as needed for pain.   [DISCONTINUED] saccharomyces boulardii (FLORASTOR) 250 MG capsule Take 250 mg by mouth daily.     Allergies:   Amoxicillin-pot clavulanate, Cefdinir, Eliquis [apixaban], Ciprofloxacin, Codeine, Cymbalta [duloxetine hcl], Lyrica [pregabalin], Macrodantin [nitrofurantoin], and Sulfa antibiotics   Social History   Socioeconomic History   Marital status: Married    Spouse name: Not on file   Number of children: Not on file   Years of education: Not on file   Highest education level: Not on file  Occupational History   Not on file  Tobacco Use   Smoking status: Every Day    Packs/day: 0.50    Years: 30.00    Pack years: 15.00    Types: Cigarettes   Smokeless tobacco: Never  Substance and Sexual Activity   Alcohol use: Yes    Comment: rare   Drug use: No   Sexual activity: Not on file  Other Topics Concern   Not on file  Social History Narrative   Not on file   Social Determinants of Health   Financial Resource Strain: Not on file  Food Insecurity: Not on file  Transportation Needs: Not on file  Physical Activity: Not on file  Stress: Not on file  Social Connections: Not on file     Family History: The patient's family history is not on file. ROS:   Please see the history of present illness.    All 14 point review of systems negative except as described per history of present illness.  EKGs/Labs/Other Studies Reviewed:    The following studies were reviewed today: I did review record from the emergency room.  INR was  normal, TSH was normal.  Troponin was negative kidney function creatinine Losq 0.7 which is normal  EKG:  EKG is atrial fibrillation with rate of 114.  Nonspecific ST segment changes ordered today.  The ekg ordered today demonstrates   Recent Labs: No results found for requested labs within last 8760 hours.  Recent Lipid Panel No results found for: CHOL, TRIG, HDL, CHOLHDL, VLDL, LDLCALC, LDLDIRECT  Physical Exam:    VS:  BP 108/62 (BP Location: Right Arm, Patient  Position: Sitting)    Pulse (!) 109    Ht 5\' 8"  (1.727 m)    Wt 189 lb (85.7 kg)    SpO2 95%    BMI 28.74 kg/m     Wt Readings from Last 3 Encounters:  11/14/21 189 lb (85.7 kg)  07/16/15 209 lb 10.5 oz (95.1 kg)  11/02/12 217 lb (98.4 kg)     GEN:  Well nourished, well developed in no acute distress HEENT: Normal NECK: No JVD; No carotid bruits LYMPHATICS: No lymphadenopathy CARDIAC: Irregularly irregular, no murmurs, no rubs, no gallops RESPIRATORY:  Clear to auscultation without rales, wheezing or rhonchi  ABDOMEN: Soft, non-tender, non-distended MUSCULOSKELETAL:  No edema; No deformity  SKIN: Warm and dry NEUROLOGIC:  Alert and oriented x 3 PSYCHIATRIC:  Normal affect   ASSESSMENT:    1. Atrial fibrillation, unspecified type ([place])   2. Persistent atrial fibrillation ([place])   3. Dyspnea on exertion   4. Dyslipidemia    PLAN:    In order of problems listed above:  Atrial fibrillation persistent, first documented episode.  She is tolerating this quite well.  I will switch her from Eliquis that she did have some side effect of 2 Xarelto 20 mg daily I told her to take it with food.  I will also increase dose of diltiazem CD from 1 20-1 80 hoping for better control of AV node.  She will see me back in about a month and at that time we will decide about cardioversion.  In the meantime  get echocardiogram to assess left ventricle ejection fraction as well as left atrial size. I suspect she might have some sleep apnea.   Her husband who is with her in the room telling me that she snores some and sometimes it was stopped breathing sweaty future we will need to do sleep study. Dyslipidemia her LDL is 157 we will discuss this next visit about potentially starting statin therapy since she appears to be somewhat fragile and sensitive to medication I prefer to wait until we get atrial fibrillation situation under control.   Medication Adjustments/Labs and Tests Ordered: Current medicines are reviewed at length with the patient today.  Concerns regarding medicines are outlined above.  Orders Placed This Encounter  Procedures   EKG 12-Lead   ECHOCARDIOGRAM COMPLETE   Meds ordered this encounter  Medications   diltiazem (CARDIZEM CD) 180 MG 24 hr capsule    Sig: Take 1 capsule (180 mg total) by mouth daily.    Dispense:  90 capsule    Refill:  3   rivaroxaban (XARELTO) 20 MG TABS tablet    Sig: Take 1 tablet (20 mg total) by mouth daily with supper.    Dispense:  30 tablet    Refill:  12    Signed, Park Liter, MD, [facility]. 11/14/2021 5:01 PM    [place]

## 2021-11-30 ENCOUNTER — Other Ambulatory Visit: Payer: Self-pay

## 2021-11-30 ENCOUNTER — Ambulatory Visit (INDEPENDENT_AMBULATORY_CARE_PROVIDER_SITE_OTHER): Payer: Medicare Other

## 2021-11-30 DIAGNOSIS — I4891 Unspecified atrial fibrillation: Secondary | ICD-10-CM | POA: Diagnosis not present

## 2021-11-30 LAB — ECHOCARDIOGRAM COMPLETE
Area-P 1/2: 6.74 cm2
Calc EF: 45.7 %
S' Lateral: 2.9 cm
Single Plane A2C EF: 49.1 %
Single Plane A4C EF: 41.7 %

## 2021-12-04 ENCOUNTER — Telehealth: Payer: Self-pay

## 2021-12-04 NOTE — Telephone Encounter (Signed)
Spoke with patient regarding results and recommendation.  Patient verbalizes understanding and is agreeable to plan of care. Advised patient to call back with any issues or concerns.  

## 2021-12-04 NOTE — Telephone Encounter (Signed)
-----   Message from Park Liter, MD sent at 12/04/2021 12:53 PM EST ----- Echocardiogram showed low normal ejection fraction mild mitral valve regurgitation overall looks good

## 2021-12-07 ENCOUNTER — Telehealth: Payer: Self-pay | Admitting: Cardiology

## 2021-12-07 NOTE — Telephone Encounter (Signed)
Pt c/o medication issue:  1. Name of Medication: rivaroxaban (XARELTO) 20 MG TABS tablet  2. How are you currently taking this medication (dosage and times per day)? 1 tablet daily  3. Are you having a reaction (difficulty breathing--STAT)? Not now, happens about an hour after taking it in the evening  4. What is your medication issue? Patient states she was put on eliquis and could not take it so she was switched to xarelto. She says she is having the same problem. She says within an hour after taking the xarelto she has trouble breathing. Patient did not have difficulty breathing at the time of the call. She says when it happens if someone is next to her they can hear it. She says she has been trying to tolerate it but it is getting bad. She says this happened when she took the eliquis so she was taken off of it. She states she does not want to be put on coumadin, because vegetables are too important to her. She would like to know if she could take a baby aspirin or if there is another blood thinner she can try. She says she takes the medication with her biggest meal and the symptoms start after an hour after and last all night. She says she does not sleep well, due to it and has really bad wheezing at night.

## 2021-12-12 NOTE — Telephone Encounter (Signed)
Spoke to the patient just now and let her know Dr. Wendy Poet recommendations. She tells me that she does not believe her symptoms were from the xarelto after all and she will continue it. She will see him Friday and will discuss this further with him then.    Encouraged patient to call back with any questions or concerns.

## 2021-12-13 ENCOUNTER — Ambulatory Visit: Payer: TRICARE For Life (TFL) | Admitting: Cardiology

## 2021-12-14 ENCOUNTER — Other Ambulatory Visit: Payer: Self-pay

## 2021-12-14 ENCOUNTER — Encounter: Payer: Self-pay | Admitting: *Deleted

## 2021-12-14 ENCOUNTER — Ambulatory Visit (INDEPENDENT_AMBULATORY_CARE_PROVIDER_SITE_OTHER): Payer: Medicare Other | Admitting: Cardiology

## 2021-12-14 ENCOUNTER — Encounter: Payer: Self-pay | Admitting: Cardiology

## 2021-12-14 VITALS — BP 128/68 | HR 108 | Ht 68.0 in | Wt 187.0 lb

## 2021-12-14 DIAGNOSIS — I4819 Other persistent atrial fibrillation: Secondary | ICD-10-CM

## 2021-12-14 DIAGNOSIS — E782 Mixed hyperlipidemia: Secondary | ICD-10-CM | POA: Diagnosis not present

## 2021-12-14 DIAGNOSIS — R0609 Other forms of dyspnea: Secondary | ICD-10-CM | POA: Diagnosis not present

## 2021-12-14 DIAGNOSIS — M797 Fibromyalgia: Secondary | ICD-10-CM | POA: Diagnosis not present

## 2021-12-14 NOTE — Progress Notes (Signed)
Cardiology Office Note:    Date:  12/14/2021   ID:  Samantha Holmes, DOB 09/19/1956, MRN [id number]  PCP:  Dineen Kid, MD  Cardiologist:  Jenne Campus, MD    Referring MD: Dineen Kid, MD   No chief complaint on file.   History of Present Illness:    Samantha Holmes is a 66 y.o. female who was referred originally to me in the second portion of December because of atrial fibrillation that she probably had for a few months already.  She does feel some palpitation but not much.  Also recently she had COVID-19 infection and she simply did not feel well that related to investigation which identified presence of atrial fibrillation.  Her AV blockade is achieved with Cardizem, she is anticoagulated initially we try Eliquis however she was unable to tolerate it now she is on Xarelto and she continue taking without interruption there was however interaction between Eliquis and Xarelto therefore I prefer her to continue for another 2 weeks before attempt cardioverting her.  She did have echocardiogram which showed preserved left ventricle ejection fraction, her biatrial size is normal. 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    Fibromyalgia    H/O hiatal hernia    Hepatitis    hx of yellow jaundice in 3rd grade    Pelvic fracture (H[facility])    Peripheral vascular disease (HCC)    varicose veins    Pneumonia    hx of in 2003    Stroke ([place])    hx of ministrokes per pt on CT of head done 2007    Urinary tract bacterial infections    hx of     Past Surgical History:  Procedure Laterality Date   ABDOMINAL HYSTERECTOMY     partial    BLADDER SURGERY     as a child " dilate bladder"    cyst on ovary      DILATION AND CURETTAGE OF UTERUS     FOOT SURGERY     right    ORIF PATELLA  11/02/2012    Procedure: OPEN REDUCTION INTERNAL (ORIF) FIXATION PATELLA;  Surgeon: Sydnee Cabal, MD;  Location: [facility];  Service: Orthopedics;  Laterality: Right;  Partial Patellectomy and repair of extensor mechanism   TONSILLECTOMY      Current Medications: Current Meds  Medication Sig   cholecalciferol (VITAMIN D3) 25 MCG (1000 UNIT) tablet Take 3,000 Units by mouth daily.   diltiazem (CARDIZEM CD) 180 MG 24 hr capsule Take 1 capsule (180 mg total) by mouth daily.   doxycycline (VIBRAMYCIN) 100 MG capsule Take 100 mg by mouth 2 (two) times daily.   PREMARIN vaginal cream Place 1 applicator vaginally once a week. [place]   rivaroxaban (XARELTO) 20 MG TABS tablet Take 1 tablet (20 mg total) by mouth daily with supper.     Allergies:   Amoxicillin-pot clavulanate, Cefdinir, Eliquis [apixaban], Ciprofloxacin, Codeine, Cymbalta [duloxetine hcl], Lyrica [pregabalin], Macrodantin [nitrofurantoin], and Sulfa antibiotics   Social History   Socioeconomic History   Marital status: Married    Spouse name: Not on file   Number of children: Not on file   Years of education: Not on file   Highest education level: Not on file  Occupational History  Not on file  Tobacco Use   Smoking status: Every Day    Packs/day: 0.50    Years: 30.00    Pack years: 15.00    Types: Cigarettes   Smokeless tobacco: Never  Substance and Sexual Activity   Alcohol use: Yes    Comment: rare   Drug use: No   Sexual activity: Not on file  Other Topics Concern   Not on file  Social History Narrative   Not on file   Social Determinants of Health   Financial Resource Strain: Not on file  Food Insecurity: Not on file  Transportation Needs: Not on file  Physical Activity: Not on file  Stress: Not on file  Social Connections: Not on file     Family History: The patient's family history is not on file. ROS:   Please see the history of present illness.    All 14 point review of systems negative except as described per  history of present illness  EKGs/Labs/Other Studies Reviewed:      1. Left ventricular ejection fraction, by estimation, is 50 to 55%. The  left ventricle has low normal function. The left ventricle has no regional  wall motion abnormalities. Left ventricular diastolic parameters are  indeterminate.   2. Right ventricular systolic function is normal. The right ventricular  size is normal. There is normal pulmonary artery systolic pressure.   3. The mitral valve is normal in structure. Mild mitral valve  regurgitation. No evidence of mitral stenosis.   4. The aortic valve is normal in structure. Aortic valve regurgitation is  not visualized. No aortic stenosis is present.   5. The inferior vena cava is normal in size with greater than 50%  respiratory variability, suggesting right atrial pressure of 3 mmHg.   Recent Labs: No results found for requested labs within last 8760 hours.  Recent Lipid Panel No results found for: CHOL, TRIG, HDL, CHOLHDL, VLDL, LDLCALC, LDLDIRECT  Physical Exam:    VS:  BP 128/68    Pulse (!) 108    Ht 5\' 8"  (1.727 m)    Wt 187 lb (84.8 kg)    SpO2 97%    BMI 28.43 kg/m     Wt Readings from Last 3 Encounters:  12/14/21 187 lb (84.8 kg)  11/14/21 189 lb (85.7 kg)  07/16/15 209 lb 10.5 oz (95.1 kg)     GEN:  Well nourished, well developed in no acute distress HEENT: Normal NECK: No JVD; No carotid bruits LYMPHATICS: No lymphadenopathy CARDIAC: RRR, no murmurs, no rubs, no gallops RESPIRATORY:  Clear to auscultation without rales, wheezing or rhonchi  ABDOMEN: Soft, non-tender, non-distended MUSCULOSKELETAL:  No edema; No deformity  SKIN: Warm and dry LOWER EXTREMITIES: no swelling NEUROLOGIC:  Alert and oriented x 3 PSYCHIATRIC:  Normal affect   ASSESSMENT:    1. Persistent atrial fibrillation ([place])   2. Fibromyalgia   3. Mixed hyperlipidemia   4. Dyspnea on exertion    PLAN:    In order of problems listed above:  Persistent atrial  fibrillation.  CHADS2 Vascor equals 2 for her age and being a woman she is anticoagulated asked her not to interrupt anticoagulation I will schedule her to have elective cardioversion about 2 weeks.  She was told that she cannot stop Xarelto until cardioversion is done and thereafter.  We will continue rest of her medications.  I did explain procedure including all risk benefits as well as alternatives Mixed dyslipidemia I initiated conversation about potentially treating  this problem her LDL was 157 which is absolutely unacceptable however she does not want to do anything right now she wants to take care of her atrial fibrillation first.  She also tell me that she try statin with significant side effect and does not want to take that. [place] exertion multifactorial but overall stable.  Echocardiogram preserved ejection fraction.   Medication Adjustments/Labs and Tests Ordered: Current medicines are reviewed at length with the patient today.  Concerns regarding medicines are outlined above.  No orders of the defined types were placed in this encounter.  Medication changes: No orders of the defined types were placed in this encounter.   Signed, Park Liter, MD, [facility] 12/14/2021 2:23 PM    [place]

## 2021-12-14 NOTE — Progress Notes (Signed)
ERROR

## 2021-12-14 NOTE — Patient Instructions (Addendum)
Medication Instructions:   Your physician recommends that you continue on your current medications as directed. Please refer to the Current Medication list given to you today.  *If you need a refill on your cardiac medications before your next appointment, please call your pharmacy*   Lab Work:   RETURN BACK TO LAB FOR CBC AND BMET  A COUPLE OF DAYS BEFORE 12-31-21    If you have labs (blood work) drawn today and your tests are completely normal, you will receive your results only by: [place] (if you have MyChart) OR A paper copy in the mail If you have any lab test that is abnormal or we need to change your treatment, we will call you to review the results.   Testing/Procedures:  Your physician has recommended that you have a Cardioversion (DCCV). Electrical Cardioversion uses a jolt of electricity to your heart either through paddles or wired patches attached to your chest. This is a controlled, usually prescheduled, procedure. Defibrillation is done under light anesthesia in the hospital, and you usually go home the day of the procedure. This is done to get your heart back into a normal rhythm. You are not awake for the procedure. Please see the instruction sheet given to you today.    Follow-Up: At [facility], you and your health needs are our priority.  As part of our continuing mission to provide you with exceptional heart care, we have created designated Provider Care Teams.  These Care Teams include your primary Cardiologist (physician) and Advanced Practice Providers (APPs -  Physician Assistants and Nurse Practitioners) who all work together to provide you with the care you need, when you need it.  We recommend signing up for the patient portal called "MyChart".  Sign up information is provided on this After Visit Summary.  MyChart is used to connect with patients for Virtual Visits (Telemedicine).  Patients are able to view lab/test results, encounter notes, upcoming  appointments, etc.  Non-urgent messages can be sent to your provider as well.   To learn more about what you can do with MyChart, go to [email or web address].    Your next appointment:    6 week(s) AFTER 12-31-21 CARDIOVERSION FOR DCCV FOLLOW UP   The format for your next appointment:   In Person  Provider:   Jenne Campus, MD    Other Instructions

## 2021-12-18 ENCOUNTER — Telehealth: Payer: Self-pay | Admitting: Cardiology

## 2021-12-18 NOTE — Telephone Encounter (Signed)
Called patient to get more information. Patient very concerned and adamantly wants to cancel her cardioversion for Feb. 6th. Patient states that she met Dr. Agustin Cree in the ER and that he prescribed her diltiazem and explained that she would need to be on it for 90 days and then they would do a cardioversion. She stated that she has only been on the medication for 30 days and now Dr. Agustin Cree wants to go ahead and do the cardioversion after being on the medication for only 30 days. She also stated that her echo was normal and her lung xray as well as her CT was fine. She also spoke to her family about this and her primary physician and she stated that he was very concerned. I asked her if she wanted me to send a message to Dr. Agustin Cree so he could address her concerns and she said that she just wanted the cardioversion canceled and she would have to get a second opinion. Cardioversion was cancelled.

## 2021-12-18 NOTE — Telephone Encounter (Signed)
Patient wants to cancel the appt for her heart stock on Feb 6th. She states she has only been on medication for 30 days not 90 days.  She had echo and that was normal. Lung x-ray was fine.

## 2021-12-31 ENCOUNTER — Ambulatory Visit (HOSPITAL_COMMUNITY): Admit: 2021-12-31 | Payer: Medicare Other | Admitting: Internal Medicine

## 2021-12-31 ENCOUNTER — Encounter (HOSPITAL_COMMUNITY): Payer: Self-pay

## 2021-12-31 SURGERY — CARDIOVERSION
Anesthesia: General

## 2022-02-11 ENCOUNTER — Ambulatory Visit: Payer: Medicare Other | Admitting: Cardiology

## 2022-07-16 ENCOUNTER — Ambulatory Visit (INDEPENDENT_AMBULATORY_CARE_PROVIDER_SITE_OTHER): Payer: Medicare Other | Admitting: Urology

## 2022-07-16 ENCOUNTER — Encounter: Payer: Self-pay | Admitting: Urology

## 2022-07-16 VITALS — Ht 68.0 in | Wt 184.0 lb

## 2022-07-16 DIAGNOSIS — N952 Postmenopausal atrophic vaginitis: Secondary | ICD-10-CM

## 2022-07-16 DIAGNOSIS — Z87440 Personal history of urinary (tract) infections: Secondary | ICD-10-CM | POA: Diagnosis not present

## 2022-07-16 MED ORDER — PREMARIN 0.625 MG/GM VA CREA: TOPICAL_CREAM | VAGINAL | 6 refills | Status: DC

## 2022-07-16 NOTE — Progress Notes (Signed)
Assessment: 1. History of UTI   2. Atrophic vaginitis     Plan: I reviewed the patient records from [place] including office notes. Continue Premarin vaginal cream 1-2 times per week. Return to office in 1 year.  Chief Complaint:  Chief Complaint  Patient presents with   Recurrent UTI    History of Present Illness:  Samantha Holmes is a 66 y.o. female who is seen for continued evaluation of recurrent UTI's.   She has a long history of UTI's going back to her childhood.  She underwent urethral dilation as child.  She was previously followed by Dr. Hessie Diener.  Typical UTI symptoms include cloudy urine, urine odor, and dysuria.  She has been treated at [facility].  She has been treated with Keflex due to allergies/reactions to other antibiotics. She reported dysuria at her visit on 05/23/17.  Her urinalysis was unremarkable.   She took [place] 7 days area and she was doing well until several days before her appointment in July 2018 when she noted cloudy urine with bladder pressure.  Urine culture from 7/18 grew Klebsiella.  Treated with Omnicef and started on daily TMP.  She discontinued the daily trimethoprim due to potential side effects - feeling weak and tired with generalized body aches, irregular heartbeat, and nausea.  She also experienced side effects with the [place]. She was changed to daily Keflex at her visit on 07/12/17. She did well on the daily antibiotic.  At the time of her visit in January 2019, she was doing well on the daily antibiotic.  In April 2019, she noted vaginal pain and cloudy urine. She increased the cephalexin to 250 mg TID. She also took doxycycline for several days without benefit. Urine culture from 03/03/18 grew >100K Enterobacter. She was treated with Gentamicin 80 mg IM daily x 3 doses. Her symptoms improved. Urine culture from 03/11/2018 grew 50-100 K Enterobacter. She was treated with gentamicin x 2 doses but did not wish to continue  treatment. She was referred to ID and was seen by Dr. Vanessa [place]. Unfortunately, there were no options for outpatient antibiotic treatment. CT imaging from 03/19/2018 showed no renal or ureteral calculi, probable right renal cyst, and no obvious bladder abnormality. She was last seen in May 2022.  She was doing well at that time.  She had not had any UTIs since her visit in the prior year.  She continued to use Premarin cream 1 time per week. No dysuria or gross hematuria.  She returns today for follow-up.  She is not had any UTI symptoms since her visit in May 2022.  She continues to use the Premarin vaginal cream approximately once per week.  No dysuria or gross hematuria.  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    Fibromyalgia    H/O hiatal hernia    Hepatitis    hx of yellow jaundice in 3rd grade    Pelvic fracture (H[facility])    Peripheral vascular disease (HCC)    varicose veins    Pneumonia    hx of in 2003    Stroke ([place])    hx of ministrokes per pt on CT of head done 2007    Urinary tract bacterial infections    hx of  Past Surgical History:  Past Surgical History:  Procedure Laterality Date   ABDOMINAL HYSTERECTOMY     partial    BLADDER SURGERY     as a child " dilate bladder"    cyst on ovary      DILATION AND CURETTAGE OF UTERUS     FOOT SURGERY     right    ORIF PATELLA  11/02/2012   Procedure: OPEN REDUCTION INTERNAL (ORIF) FIXATION PATELLA;  Surgeon: Sydnee Cabal, MD;  Location: [facility];  Service: Orthopedics;  Laterality: Right;  Partial Patellectomy and repair of extensor mechanism   TONSILLECTOMY      Allergies:  Allergies  Allergen Reactions   Amoxicillin-Pot Clavulanate Other (See Comments)    Extreme Nervousness    Cefdinir Other (See Comments)    Dizziness, Abdominal pain   Eliquis [Apixaban] Shortness  Of Breath   Ciprofloxacin Other (See Comments)    "Nervous system " High anxiety.    Codeine Nausea And Vomiting   Cymbalta [Duloxetine Hcl] Other (See Comments)    Due to side effects    Lyrica [Pregabalin] Other (See Comments)    Vision problems and other side effects    Macrodantin [Nitrofurantoin] Swelling    Reddened in face    Sulfa Antibiotics     Unsure of reaction     Family History:  No family history on file.  Social History:  Social History   Tobacco Use   Smoking status: Every Day    Packs/day: 0.50    Years: 30.00    Total pack years: 15.00    Types: Cigarettes   Smokeless tobacco: Never  Substance Use Topics   Alcohol use: Yes    Comment: rare   Drug use: No    Review of symptoms:  Constitutional:  Negative for unexplained weight loss, night sweats, fever, chills ENT:  Negative for nose bleeds, sinus pain, painful swallowing CV:  Negative for chest pain, shortness of breath, exercise intolerance, palpitations, loss of consciousness Resp:  Negative for cough, wheezing, shortness of breath GI:  Negative for nausea, vomiting, diarrhea, bloody stools GU:  Positives noted in HPI; otherwise negative for gross hematuria, dysuria, urinary incontinence Neuro:  Negative for seizures, poor balance, limb weakness, slurred speech Psych:  Negative for lack of energy, depression, anxiety Endocrine:  Negative for polydipsia, polyuria, symptoms of hypoglycemia (dizziness, hunger, sweating) Hematologic:  Negative for anemia, purpura, petechia, prolonged or excessive bleeding, use of anticoagulants  Allergic:  Negative for difficulty breathing or choking as a result of exposure to anything; no shellfish allergy; no allergic response (rash/itch) to materials, foods  Physical exam: Ht '5\' 8"'$  (1.727 m)   Wt 184 lb (83.5 kg)   BMI 27.98 kg/m  GENERAL APPEARANCE:  Well appearing, well developed, well nourished, NAD HEENT: Atraumatic, Normocephalic, oropharynx clear. NECK:  Supple without lymphadenopathy or thyromegaly. LUNGS: Clear to auscultation bilaterally. HEART: Regular Rate and Rhythm without murmurs, gallops, or rubs. ABDOMEN: Soft, non-tender, No Masses. EXTREMITIES: Moves all extremities well.  Without clubbing, cyanosis, or edema. NEUROLOGIC:  Alert and oriented x 3, normal gait, CN II-XII grossly intact.  MENTAL STATUS:  Appropriate. BACK:  Non-tender to palpation.  No CVAT SKIN:  Warm, dry and intact.    Results: U/A: dipstick negative

## 2022-07-17 LAB — URINALYSIS, ROUTINE W REFLEX MICROSCOPIC
Bilirubin, UA: NEGATIVE
Glucose, UA: NEGATIVE
Ketones, UA: NEGATIVE
Leukocytes,UA: NEGATIVE
Nitrite, UA: NEGATIVE
Protein,UA: NEGATIVE
RBC, UA: NEGATIVE
Specific Gravity, UA: <1.005 — ABNORMAL LOW (ref 1.005–1.030)
Urobilinogen, Ur: 1 mg/dL (ref 0.2–1.0)
pH, UA: 5.5 (ref 5.0–7.5)

## 2022-08-24 ENCOUNTER — Other Ambulatory Visit: Payer: Self-pay

## 2022-08-24 ENCOUNTER — Encounter (HOSPITAL_BASED_OUTPATIENT_CLINIC_OR_DEPARTMENT_OTHER): Payer: Self-pay | Admitting: Emergency Medicine

## 2022-08-24 ENCOUNTER — Emergency Department (HOSPITAL_BASED_OUTPATIENT_CLINIC_OR_DEPARTMENT_OTHER)
Admission: EM | Admit: 2022-08-24 | Discharge: 2022-08-24 | Disposition: A | Discharge status: Home / Self Care | Payer: Medicare Other | Attending: Emergency Medicine | Admitting: Emergency Medicine

## 2022-08-24 ENCOUNTER — Emergency Department (HOSPITAL_BASED_OUTPATIENT_CLINIC_OR_DEPARTMENT_OTHER): Payer: Medicare Other

## 2022-08-24 DIAGNOSIS — R000 Tachycardia, unspecified: Secondary | ICD-10-CM | POA: Insufficient documentation

## 2022-08-24 DIAGNOSIS — Z7982 Long term (current) use of aspirin: Secondary | ICD-10-CM | POA: Diagnosis not present

## 2022-08-24 DIAGNOSIS — U071 COVID-19: Secondary | ICD-10-CM | POA: Diagnosis present

## 2022-08-24 DIAGNOSIS — E876 Hypokalemia: Secondary | ICD-10-CM | POA: Insufficient documentation

## 2022-08-24 DIAGNOSIS — R0600 Dyspnea, unspecified: Secondary | ICD-10-CM | POA: Diagnosis not present

## 2022-08-24 LAB — CBC WITH DIFFERENTIAL/PLATELET
Abs Immature Granulocytes: 0.01 10*3/uL (ref 0.00–0.07)
Basophils Absolute: 0 10*3/uL (ref 0.0–0.1)
Basophils Relative: 1 %
Eosinophils Absolute: 0 10*3/uL (ref 0.0–0.5)
Eosinophils Relative: 1 %
HCT: 39.6 % (ref 36.0–46.0)
Hemoglobin: 13.3 g/dL (ref 12.0–15.0)
Immature Granulocytes: 0 %
Lymphocytes Relative: 26 %
Lymphs Abs: 1.3 10*3/uL (ref 0.7–4.0)
MCH: 31 pg (ref 26.0–34.0)
MCHC: 33.6 g/dL (ref 30.0–36.0)
MCV: 92.3 fL (ref 80.0–100.0)
Monocytes Absolute: 0.4 10*3/uL (ref 0.1–1.0)
Monocytes Relative: 8 %
Neutro Abs: 3.2 10*3/uL (ref 1.7–7.7)
Neutrophils Relative %: 64 %
Platelets: 241 10*3/uL (ref 150–400)
RBC: 4.29 MIL/uL (ref 3.87–5.11)
RDW: 13.2 % (ref 11.5–15.5)
Smear Review: NORMAL
WBC: 5 10*3/uL (ref 4.0–10.5)
nRBC: 0 % (ref 0.0–0.2)

## 2022-08-24 LAB — COMPREHENSIVE METABOLIC PANEL
ALT: 16 U/L (ref 0–44)
AST: 21 U/L (ref 15–41)
Albumin: 3.5 g/dL (ref 3.5–5.0)
Alkaline Phosphatase: 79 U/L (ref 38–126)
Anion gap: 9 (ref 5–15)
BUN: 12 mg/dL (ref 8–23)
CO2: 24 mmol/L (ref 22–32)
Calcium: 8.5 mg/dL — ABNORMAL LOW (ref 8.9–10.3)
Chloride: 109 mmol/L (ref 98–111)
Creatinine, Ser: 0.72 mg/dL (ref 0.44–1.00)
GFR, Estimated: >60 mL/min (ref >60)
Glucose, Bld: 84 mg/dL (ref 70–99)
Potassium: 3 mmol/L — ABNORMAL LOW (ref 3.5–5.1)
Sodium: 142 mmol/L (ref 135–145)
Total Bilirubin: 0.7 mg/dL (ref 0.3–1.2)
Total Protein: 7.7 g/dL (ref 6.5–8.1)

## 2022-08-24 LAB — MAGNESIUM: Magnesium: 1.9 mg/dL (ref 1.7–2.4)

## 2022-08-24 LAB — BRAIN NATRIURETIC PEPTIDE: B Natriuretic Peptide: 78.1 pg/mL (ref 0.0–100.0)

## 2022-08-24 MED ORDER — POTASSIUM CHLORIDE CRYS ER 20 MEQ PO TBCR
40.0000 meq | EXTENDED_RELEASE_TABLET | Freq: Once | ORAL | Status: AC
  Administered 2022-08-24: 40 meq via ORAL
  Filled 2022-08-24: qty 2

## 2022-08-24 MED ORDER — BENZONATATE 100 MG PO CAPS: 100.0000 mg | ORAL_CAPSULE | Freq: Three times a day (TID) | ORAL | 0 refills | Status: AC

## 2022-08-24 MED ORDER — SODIUM CHLORIDE 0.9 % IV BOLUS
1000.0000 mL | Freq: Once | INTRAVENOUS | Status: AC
  Administered 2022-08-24: 1000 mL via INTRAVENOUS

## 2022-08-24 MED ORDER — ACETAMINOPHEN 500 MG PO TABS
1000.0000 mg | ORAL_TABLET | Freq: Once | ORAL | Status: AC
  Administered 2022-08-24: 1000 mg via ORAL
  Filled 2022-08-24: qty 2

## 2022-08-24 NOTE — ED Triage Notes (Signed)
Patient reports tested positive for COVID on 08/13/2022 and 08/20/2022. Pt c/o nasal congestion, cough and shortness of breath.

## 2022-08-24 NOTE — ED Notes (Signed)
Patient ambulated to triage with pulse ox. Patient O2 sats 90-93% and HR 120-140's; patient has a history of afib. EKG and vitals obtained.

## 2022-08-24 NOTE — Discharge Instructions (Signed)
Please eat something high in potassium with each meal until you are able to follow-up with your doctor to have your potassium rechecked.  Eat and drink as well as you can.  Try to increase your fluid intake.  I prescribed you Tessalon Per[place].  Please return for worsening difficulty breathing confusion.   Also take tylenol '1000mg'$ (2 extra strength) four times a day.

## 2022-08-24 NOTE — ED Provider Notes (Signed)
[facility] DEPARTMENT Provider Note   CSN: [id number] Arrival date & time: 08/24/22  1222     History  Chief Complaint  Patient presents with   Covid Positive    Samantha Holmes is a 66 y.o. female.  66 yo F with a chief complaints of cough and difficulty breathing.  This been going on for couple weeks now.  Patient has been diagnosed with COVID.  She had seen her doctor and then urgent care.  She is currently finishing a course of azithromycin.  Has been on cough medicine as well.  She is concerned because her symptoms have not significantly improved.  Still having coughing fits at home.  Feels like her heart is racing at times.  Has been taking her diltiazem as prescribed.        Home Medications Prior to Admission medications   Medication Sig Start Date End Date Taking? Authorizing Provider  benzonatate (TESSALON) 100 MG capsule Take 1 capsule (100 mg total) by mouth every 8 (eight) hours. 08/24/22  Yes Deno Etienne, DO  aspirin 81 MG chewable tablet Chew 81 mg by mouth daily.    [provider]  cholecalciferol (VITAMIN D3) 25 MCG (1000 UNIT) tablet Take 3,000 Units by mouth daily.    [provider]  conjugated estrogens (PREMARIN) vaginal cream Apply 1 gm vaginally once a week 07/16/22   Stoneking, Reece Leader., MD  diltiazem (CARDIZEM CD) 180 MG 24 hr capsule Take 1 capsule (180 mg total) by mouth daily. 11/14/21   Park Liter, MD      Allergies    Amoxicillin-pot clavulanate, Cefdinir, Eliquis [apixaban], Ciprofloxacin, Codeine, Cymbalta [duloxetine hcl], Lyrica [pregabalin], Macrodantin [nitrofurantoin], and Sulfa antibiotics    Review of Systems   Review of Systems  Physical Exam Updated Vital Signs BP 129/74   Pulse 94   Temp 97.8 F (36.6 C) (Oral)   Resp 20   Ht '5\' 8"'$  (1.727 m)   Wt 82.6 kg   SpO2 95%   BMI 27.67 kg/m  Physical Exam Vitals and nursing note reviewed.  Constitutional:      General: She is not  in acute distress.    Appearance: She is well-developed. She is not diaphoretic.  HENT:     Head: Normocephalic and atraumatic.  Eyes:     Pupils: Pupils are equal, round, and reactive to light.  Cardiovascular:     Rate and Rhythm: Tachycardia present. Rhythm irregular.     Heart sounds: No murmur heard.    No friction rub. No gallop.  Pulmonary:     Effort: Pulmonary effort is normal.     Breath sounds: No wheezing or rales.  Abdominal:     General: There is no distension.     Palpations: Abdomen is soft.     Tenderness: There is no abdominal tenderness.  Musculoskeletal:        General: No tenderness.     Cervical back: Normal range of motion and neck supple.  Skin:    General: Skin is warm and dry.  Neurological:     Mental Status: She is alert and oriented to person, place, and time.  Psychiatric:        Behavior: Behavior normal.     ED Results / Procedures / Treatments   Labs (all labs ordered are listed, but only abnormal results are displayed) Labs Reviewed  COMPREHENSIVE METABOLIC PANEL - Abnormal; Notable for the following components:      Result Value  Potassium 3.0 (*)    Calcium 8.5 (*)    All other components within normal limits  CBC WITH DIFFERENTIAL/PLATELET  MAGNESIUM  BRAIN NATRIURETIC PEPTIDE    EKG EKG Interpretation  Date/Time:  Saturday August 24 2022 12:36:58 EDT Ventricular Rate:  124 PR Interval:    QRS Duration: 72 QT Interval:  318 QTC Calculation: 456 R Axis:   90 Text Interpretation: Atrial fibrillation with rapid ventricular response Rightward axis ST & T wave abnormality, consider inferior ischemia Abnormal ECG st changes likely rate related Confirmed by Deno Etienne [id number]) on 08/24/2022 12:43:14 PM  Radiology DG Chest 2 View  Result Date: 08/24/2022 CLINICAL DATA:  Positive COVID EXAM: CHEST - 2 VIEW COMPARISON:  None Available. FINDINGS: The heart size and mediastinal contours are within normal limits. Both lungs are  clear. The visualized skeletal structures are unremarkable. IMPRESSION: Hyperinflated lungs.  No active cardiopulmonary disease. Electronically Signed   By: Suzy Bouchard M.D.   On: 08/24/2022 13:42    Procedures Procedures    Medications Ordered in ED Medications  sodium chloride 0.9 % bolus 1,000 mL (0 mLs Intravenous Stopped 08/24/22 1457)  acetaminophen (TYLENOL) tablet 1,000 mg (1,000 mg Oral Given 08/24/22 1349)  potassium chloride SA (KLOR-CON M) CR tablet 40 mEq (40 mEq Oral Given 08/24/22 1456)    ED Course/ Medical Decision Making/ A&P                           Medical Decision Making Amount and/or Complexity of Data Reviewed Labs: ordered. Radiology: ordered.  Risk OTC drugs. Prescription drug management.   66 yo F with a chief complaints of cough congestion and shortness of breath.  This has been ongoing for the past couple weeks.  She was diagnosed with COVID at home and then in urgent care setting.  Has taken a course of azithromycin as well as taking dextromethorphan DM.    She arrived in A-fib with RVR.  Heart rate had improved somewhat by my exam.  We will give a bolus of IV fluids.  Lab work.  Chest x-ray.  Chest x-ray independently interpreted by me without focal infiltrate and pneumothorax.  Patient's lab work without significant anemia.  Patient does have a potassium of 3.  I discussed this with her.  We will give oral supplementation here.  We will have her increase her dietary potassium at home.  Patient's heart rate is improved significantly with IV fluids.  Now rate controlled.  We will change her cough medicine to [facility].  PCP follow-up.  3:19 PM:  I have discussed the diagnosis/risks/treatment options with the patient.  Evaluation and diagnostic testing in the emergency department does not suggest an emergent condition requiring admission or immediate intervention beyond what has been performed at this time.  They will follow up with  PCP. We  also discussed returning to the ED immediately if new or worsening sx occur. We discussed the sx which are most concerning (e.g., sudden worsening pain, fever, inability to tolerate by mouth) that necessitate immediate return. Medications administered to the patient during their visit and any new prescriptions provided to the patient are listed below.  Medications given during this visit Medications  sodium chloride 0.9 % bolus 1,000 mL (0 mLs Intravenous Stopped 08/24/22 1457)  acetaminophen (TYLENOL) tablet 1,000 mg (1,000 mg Oral Given 08/24/22 1349)  potassium chloride SA (KLOR-CON M) CR tablet 40 mEq (40 mEq Oral Given 08/24/22 1456)  The patient appears reasonably screen and/or stabilized for discharge and I doubt any other medical condition or other [facility] requiring further screening, evaluation, or treatment in the ED at this time prior to discharge.          Final Clinical Impression(s) / ED Diagnoses Final diagnoses:  [id number] virus infection  Hypokalemia    Rx / DC Orders ED Discharge Orders          Ordered    benzonatate (TESSALON) 100 MG capsule  Every 8 hours        08/24/22 [place], Pantelis Elgersma, DO 08/24/22 1519

## 2023-02-21 ENCOUNTER — Other Ambulatory Visit: Payer: Self-pay | Admitting: Urology

## 2023-02-21 DIAGNOSIS — N952 Postmenopausal atrophic vaginitis: Secondary | ICD-10-CM

## 2023-07-17 ENCOUNTER — Ambulatory Visit: Payer: Medicare Other | Admitting: Urology

## 2023-07-23 ENCOUNTER — Encounter: Payer: Self-pay | Admitting: Urology

## 2023-07-23 ENCOUNTER — Ambulatory Visit (INDEPENDENT_AMBULATORY_CARE_PROVIDER_SITE_OTHER): Payer: Medicare Other | Admitting: Urology

## 2023-07-23 VITALS — BP 146/91 | HR 80 | Ht 68.0 in | Wt 190.0 lb

## 2023-07-23 DIAGNOSIS — Z87440 Personal history of urinary (tract) infections: Secondary | ICD-10-CM | POA: Diagnosis not present

## 2023-07-23 DIAGNOSIS — N952 Postmenopausal atrophic vaginitis: Secondary | ICD-10-CM

## 2023-07-23 MED ORDER — PREMARIN 0.625 MG/GM VA CREA: TOPICAL_CREAM | VAGINAL | 0 refills | Status: DC

## 2023-07-23 NOTE — Progress Notes (Signed)
Assessment: 1. History of UTI   2. Atrophic vaginitis     Plan: Continue Premarin vaginal cream 1-2 times per week. Return to office in 1 year.  Chief Complaint:  Chief Complaint  Patient presents with   History of UTI    History of Present Illness:  Samantha Holmes is a 67 y.o. female who is seen for continued evaluation of recurrent UTI's.   She has a long history of UTI's going back to her childhood.  She underwent urethral dilation as child.  She was previously followed by Dr. Larey Dresser.  Typical UTI symptoms include cloudy urine, urine odor, and dysuria.  She has been treated at [facility].  She has been treated with Keflex due to allergies/reactions to other antibiotics. She reported dysuria at her visit on 05/23/17.  Her urinalysis was unremarkable.   She took [place] 7 days area and she was doing well until several days before her appointment in July 2018 when she noted cloudy urine with bladder pressure.  Urine culture from 7/18 grew Klebsiella.  Treated with Omnicef and started on daily TMP.  She discontinued the daily trimethoprim due to potential side effects - feeling weak and tired with generalized body aches, irregular heartbeat, and nausea.  She also experienced side effects with the Omnicef. She was changed to daily Keflex at her visit on 07/12/17. She did well on the daily antibiotic.  At the time of her visit in January 2019, she was doing well on the daily antibiotic.  In April 2019, she noted vaginal pain and cloudy urine. She increased the cephalexin to 250 mg TID. She also took doxycycline for several days without benefit. Urine culture from 03/03/18 grew >100K Enterobacter. She was treated with Gentamicin 80 mg IM daily x 3 doses. Her symptoms improved. Urine culture from 03/11/2018 grew 50-100 K Enterobacter. She was treated with gentamicin x 2 doses but did not wish to continue treatment. She was referred to ID and was seen by Dr. Trisha Mangle.  Unfortunately, there were no options for outpatient antibiotic treatment. CT imaging from 03/19/2018 showed no renal or ureteral calculi, probable right renal cyst, and no obvious bladder abnormality. She was last seen in May 2022.  She was doing well at that time.  She had not had any UTIs since her visit in the prior year.  She continued to use Premarin cream 1 time per week. No dysuria or gross hematuria.  She return today for follow-up.  She reports a UTI in September 2023.  No recent UTI symptoms.  She continues to use Premarin vaginal cream 1-2 times per week.  No dysuria or gross hematuria.   Portions of the above documentation were copied from a prior visit for review purposes only.  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    Fibromyalgia    H/O hiatal hernia    Hepatitis    hx of yellow jaundice in 3rd grade    Pelvic fracture (HCC)    Peripheral vascular disease (HCC)    varicose veins    Pneumonia    hx of in 2003    Stroke (HCC)    hx of ministrokes per pt on CT of head done 2007    Urinary tract bacterial infections  hx of     Past Surgical History:  Past Surgical History:  Procedure Laterality Date   ABDOMINAL HYSTERECTOMY     partial    BLADDER SURGERY     as a child " dilate bladder"    cyst on ovary      DILATION AND CURETTAGE OF UTERUS     FOOT SURGERY     right    ORIF PATELLA  11/02/2012   Procedure: OPEN REDUCTION INTERNAL (ORIF) FIXATION PATELLA;  Surgeon: Eugenia Mcalpine, MD;  Location: [facility];  Service: Orthopedics;  Laterality: Right;  Partial Patellectomy and repair of extensor mechanism   TONSILLECTOMY      Allergies:  Allergies  Allergen Reactions   Amoxicillin-Pot Clavulanate Other (See Comments)    Extreme Nervousness    Cefdinir Other (See Comments)    Dizziness, Abdominal pain   Eliquis  [Apixaban] Shortness Of Breath   Ciprofloxacin Other (See Comments)    "Nervous system " High anxiety.    Codeine Nausea And Vomiting   Cymbalta [Duloxetine Hcl] Other (See Comments)    Due to side effects    Lyrica [Pregabalin] Other (See Comments)    Vision problems and other side effects    Macrodantin [Nitrofurantoin] Swelling    Reddened in face    Sulfa Antibiotics     Unsure of reaction     Family History:  No family history on file.  Social History:  Social History   Tobacco Use   Smoking status: Every Day    Current packs/day: 0.50    Average packs/day: 0.5 packs/day for 30.0 years (15.0 ttl pk-yrs)    Types: Cigarettes   Smokeless tobacco: Never  Vaping Use   Vaping status: Never Used  Substance Use Topics   Alcohol use: Yes    Comment: rare   Drug use: No    ROS: Constitutional:  Negative for fever, chills, weight loss CV: Negative for chest pain, previous MI, hypertension Respiratory:  Negative for shortness of breath, wheezing, sleep apnea, frequent cough GI:  Negative for nausea, vomiting, bloody stool, GERD  Physical exam: BP (!) 146/91   Pulse 80   Ht 5\' 8"  (1.727 m)   Wt 190 lb (86.2 kg)   BMI 28.89 kg/m  GENERAL APPEARANCE:  Well appearing, well developed, well nourished, NAD HEENT:  Atraumatic, normocephalic, oropharynx clear NECK:  Supple without lymphadenopathy or thyromegaly ABDOMEN:  Soft, non-tender, no masses EXTREMITIES:  Moves all extremities well, without clubbing, cyanosis, or edema NEUROLOGIC:  Alert and oriented x 3, normal gait, CN II-XII grossly intact MENTAL STATUS:  appropriate BACK:  Non-tender to palpation, No CVAT SKIN:  Warm, dry, and intact  Results: U/A: negative

## 2023-08-01 LAB — URINALYSIS, ROUTINE W REFLEX MICROSCOPIC
Bilirubin, UA: NEGATIVE
Glucose, UA: NEGATIVE
Ketones, UA: NEGATIVE
Leukocytes,UA: NEGATIVE
Nitrite, UA: NEGATIVE
Protein,UA: NEGATIVE
RBC, UA: NEGATIVE
Specific Gravity, UA: 1.02 (ref 1.005–1.030)
Urobilinogen, Ur: 2 mg/dL — ABNORMAL HIGH (ref 0.2–1.0)
pH, UA: 6 (ref 5.0–7.5)

## 2024-01-05 ENCOUNTER — Telehealth: Payer: Self-pay | Admitting: Urology

## 2024-01-05 NOTE — Telephone Encounter (Signed)
 Patient called and left a voicemail requesting a refill on her Premarin  cream. She would like a phone back. [phone]  Moira Andrews

## 2024-01-06 ENCOUNTER — Other Ambulatory Visit: Payer: Self-pay | Admitting: Urology

## 2024-01-06 DIAGNOSIS — N952 Postmenopausal atrophic vaginitis: Secondary | ICD-10-CM

## 2024-01-06 MED ORDER — PREMARIN 0.625 MG/GM VA CREA: TOPICAL_CREAM | VAGINAL | 11 refills | Status: DC

## 2024-01-06 NOTE — Telephone Encounter (Signed)
Pt notified and expressed understanding.

## 2024-01-11 ENCOUNTER — Other Ambulatory Visit: Payer: Self-pay | Admitting: Urology

## 2024-01-11 DIAGNOSIS — N952 Postmenopausal atrophic vaginitis: Secondary | ICD-10-CM

## 2024-01-14 LAB — COLOGUARD: COLOGUARD: POSITIVE — AB

## 2024-02-02 ENCOUNTER — Other Ambulatory Visit: Payer: Self-pay

## 2024-02-02 ENCOUNTER — Telehealth: Payer: Self-pay | Admitting: Urology

## 2024-02-02 DIAGNOSIS — N952 Postmenopausal atrophic vaginitis: Secondary | ICD-10-CM

## 2024-02-02 NOTE — Telephone Encounter (Signed)
 Pt called and lvm about the medication conjugated estrogens (PREMARIN) vaginal cream.  She stated that It was a requested that it be sent to [facility] instead of [facility]. She stated that she would like a phone call when this is done. Please advise.

## 2024-02-03 ENCOUNTER — Other Ambulatory Visit: Payer: Self-pay | Admitting: Urology

## 2024-02-03 DIAGNOSIS — N952 Postmenopausal atrophic vaginitis: Secondary | ICD-10-CM

## 2024-02-03 MED ORDER — PREMARIN 0.625 MG/GM VA CREA: TOPICAL_CREAM | VAGINAL | 5 refills | Status: AC

## 2024-02-03 NOTE — Telephone Encounter (Signed)
 Pt confirmed it is the correct pharmacy.

## 2024-07-12 ENCOUNTER — Telehealth: Payer: Self-pay | Admitting: Urology

## 2024-07-12 NOTE — Telephone Encounter (Signed)
 Samantha Holmes

## 2024-07-21 ENCOUNTER — Encounter: Payer: Self-pay | Admitting: Urology

## 2024-07-21 ENCOUNTER — Ambulatory Visit: Payer: Medicare Other | Admitting: Urology

## 2024-07-21 VITALS — BP 126/77 | HR 111 | Ht 67.0 in | Wt 200.0 lb

## 2024-07-21 DIAGNOSIS — N952 Postmenopausal atrophic vaginitis: Secondary | ICD-10-CM

## 2024-07-21 DIAGNOSIS — Z87440 Personal history of urinary (tract) infections: Secondary | ICD-10-CM

## 2024-07-21 LAB — URINALYSIS, ROUTINE W REFLEX MICROSCOPIC
Bilirubin, UA: NEGATIVE
Glucose, UA: NEGATIVE
Ketones, UA: NEGATIVE
Leukocytes,UA: NEGATIVE
Nitrite, UA: NEGATIVE
Protein,UA: NEGATIVE
RBC, UA: NEGATIVE
Specific Gravity, UA: 1.015 (ref 1.005–1.030)
Urobilinogen, Ur: 1 mg/dL (ref 0.2–1.0)
pH, UA: 6 (ref 5.0–7.5)

## 2024-07-21 NOTE — Progress Notes (Signed)
 Assessment: 1. History of UTI   2. Atrophic vaginitis     Plan: Continue Premarin  vaginal cream 1-2 times per week. Return to office in 1 year.  Chief Complaint:  Chief Complaint  Patient presents with   History of UTI    History of Present Illness:  Samantha Holmes is a 68 y.o. female who is seen for continued evaluation of recurrent UTI's.   She has a long history of UTI's going back to her childhood.  She underwent urethral dilation as child.  She was previously followed by Dr. Willma Endo.  Typical UTI symptoms include cloudy urine, urine odor, and dysuria.  She has been treated at [facility].  She has been treated with Keflex  due to allergies/reactions to other antibiotics. She reported dysuria at her visit on 05/23/17.  Her urinalysis was unremarkable.   She took Omnicef 7 days area and she was doing well until several days before her appointment in July 2018 when she noted cloudy urine with bladder pressure.  Urine culture from 7/18 grew Klebsiella.  Treated with Omnicef and started on daily TMP.  She discontinued the daily trimethoprim due to potential side effects - feeling weak and tired with generalized body aches, irregular heartbeat, and nausea.  She also experienced side effects with the Omnicef. She was changed to daily Keflex  at her visit on 07/12/17. She did well on the daily antibiotic.  At the time of her visit in January 2019, she was doing well on the daily antibiotic.  In April 2019, she noted vaginal pain and cloudy urine. She increased the cephalexin  to 250 mg TID. She also took doxycycline for several days without benefit. Urine culture from 03/03/18 grew >100K Enterobacter. She was treated with Gentamicin  80 mg IM daily x 3 doses. Her symptoms improved. Urine culture from 03/11/2018 grew 50-100 K Enterobacter. She was treated with gentamicin  x 2 doses but did not wish to continue treatment. She was referred to ID and was seen by Dr. Herlinda.  Unfortunately, there were no options for outpatient antibiotic treatment. CT imaging from 03/19/2018 showed no renal or ureteral calculi, probable right renal cyst, and no obvious bladder abnormality. She was last seen in May 2022.  She was doing well at that time.  She had not had any UTIs since her visit in the prior year.  She continued to use Premarin  cream 1 time per week. No dysuria or gross hematuria.  She was treated for a UTI in September 2023.    She returns today for 1 year follow-up.  She has done very well.  She developed some UTI symptoms in August 2025.  She was seen at [facility] urgent care.  Urine culture grew Enterococcus.  She was treated with amoxicillin x 7 days.  Her symptoms resolved.  She is not having any current UTI symptoms.  She has been using Premarin  vaginal cream 1-2 times per week.  No dysuria or gross hematuria.  Portions of the above documentation were copied from a prior visit for review purposes only.  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    Fibromyalgia    H/O hiatal hernia    Hepatitis    hx of yellow jaundice in 3rd grade    Pelvic fracture (HCC)  Peripheral vascular disease (HCC)    varicose veins    Pneumonia    hx of in 2003    Stroke (HCC)    hx of ministrokes per pt on CT of head done 2007    Urinary tract bacterial infections    hx of     Past Surgical History:  Past Surgical History:  Procedure Laterality Date   ABDOMINAL HYSTERECTOMY     partial    BLADDER SURGERY     as a child  dilate bladder    cyst on ovary      DILATION AND CURETTAGE OF UTERUS     FOOT SURGERY     right    ORIF PATELLA  11/02/2012   Procedure: OPEN REDUCTION INTERNAL (ORIF) FIXATION PATELLA;  Surgeon: Lamar Collet, MD;  Location: [facility];  Service: Orthopedics;  Laterality: Right;  Partial Patellectomy  and repair of extensor mechanism   TONSILLECTOMY      Allergies:  Allergies  Allergen Reactions   Cefdinir Other (See Comments)    Dizziness, Abdominal pain   Eliquis [Apixaban] Shortness Of Breath   Ciprofloxacin Other (See Comments)    Nervous system  High anxiety.    Codeine Nausea And Vomiting   Cymbalta [Duloxetine Hcl] Other (See Comments)    Due to side effects    Lyrica [Pregabalin] Other (See Comments)    Vision problems and other side effects    Macrodantin [Nitrofurantoin] Swelling    Reddened in face    Sulfa Antibiotics     Unsure of reaction     Family History:  History reviewed. No pertinent family history.  Social History:  Social History   Tobacco Use   Smoking status: Every Day    Current packs/day: 0.50    Average packs/day: 0.5 packs/day for 30.0 years (15.0 ttl pk-yrs)    Types: Cigarettes   Smokeless tobacco: Never  Vaping Use   Vaping status: Never Used  Substance Use Topics   Alcohol use: Yes    Comment: rare   Drug use: No    ROS: Constitutional:  Negative for fever, chills, weight loss CV: Negative for chest pain, previous MI, hypertension Respiratory:  Negative for shortness of breath, wheezing, sleep apnea, frequent cough GI:  Negative for nausea, vomiting, bloody stool, GERD   Physical exam: BP 126/77   Pulse (!) 111   Ht 5' 7 (1.702 m)   Wt 200 lb (90.7 kg)   BMI 31.32 kg/m  GENERAL APPEARANCE:  Well appearing, well developed, well nourished, NAD HEENT:  Atraumatic, normocephalic, oropharynx clear NECK:  Supple without lymphadenopathy or thyromegaly ABDOMEN:  Soft, non-tender, no masses EXTREMITIES:  Moves all extremities well, without clubbing, cyanosis, or edema NEUROLOGIC:  Alert and oriented x 3, normal gait, CN II-XII grossly intact MENTAL STATUS:  appropriate BACK:  Non-tender to palpation, No CVAT SKIN:  Warm, dry, and intact  Results: U/A: Negative

## 2025-07-21 ENCOUNTER — Ambulatory Visit: Admitting: Urology
